# Patient Record
Sex: Female | Born: 1955 | Race: White | Hispanic: No | Marital: Married | Smoking: Never smoker
Health system: Southern US, Community
[De-identification: ages and names within clinical notes are randomized; demographics above are authoritative.]

## PROBLEM LIST (undated history)

## (undated) DIAGNOSIS — D219 Benign neoplasm of connective and other soft tissue, unspecified: Secondary | ICD-10-CM

## (undated) HISTORY — PX: EYE SURGERY: SHX253

## (undated) HISTORY — DX: Benign neoplasm of connective and other soft tissue, unspecified: D21.9

## (undated) HISTORY — PX: TONSILLECTOMY AND ADENOIDECTOMY: SUR1326

---

## 1978-10-31 HISTORY — PX: TONSILLECTOMY: SUR1361

## 1999-04-05 ENCOUNTER — Other Ambulatory Visit: Admission: RE | Admit: 1999-04-05 | Discharge: 1999-04-05 | Payer: Self-pay | Admitting: Gynecology

## 2000-06-07 ENCOUNTER — Other Ambulatory Visit: Admission: RE | Admit: 2000-06-07 | Discharge: 2000-06-07 | Payer: Self-pay | Admitting: Gynecology

## 2000-07-27 ENCOUNTER — Ambulatory Visit (HOSPITAL_COMMUNITY): Admission: RE | Admit: 2000-07-27 | Discharge: 2000-07-27 | Payer: Self-pay | Admitting: Gastroenterology

## 2000-10-31 HISTORY — PX: COLONOSCOPY: SHX174

## 2000-12-21 ENCOUNTER — Other Ambulatory Visit: Admission: RE | Admit: 2000-12-21 | Discharge: 2000-12-21 | Payer: Self-pay | Admitting: Gynecology

## 2001-06-27 ENCOUNTER — Other Ambulatory Visit: Admission: RE | Admit: 2001-06-27 | Discharge: 2001-06-27 | Payer: Self-pay | Admitting: Gynecology

## 2002-06-28 ENCOUNTER — Other Ambulatory Visit: Admission: RE | Admit: 2002-06-28 | Discharge: 2002-06-28 | Payer: Self-pay | Admitting: Gynecology

## 2002-09-03 ENCOUNTER — Ambulatory Visit (HOSPITAL_COMMUNITY): Admission: RE | Admit: 2002-09-03 | Discharge: 2002-09-03 | Payer: Self-pay | Admitting: Gynecology

## 2002-09-03 ENCOUNTER — Encounter: Payer: Self-pay | Admitting: Gynecology

## 2003-07-16 ENCOUNTER — Other Ambulatory Visit: Admission: RE | Admit: 2003-07-16 | Discharge: 2003-07-16 | Payer: Self-pay | Admitting: Gynecology

## 2004-09-09 ENCOUNTER — Other Ambulatory Visit: Admission: RE | Admit: 2004-09-09 | Discharge: 2004-09-09 | Payer: Self-pay | Admitting: Gynecology

## 2004-09-30 HISTORY — PX: BLADDER SUSPENSION: SHX72

## 2004-10-05 ENCOUNTER — Ambulatory Visit (HOSPITAL_COMMUNITY): Admission: RE | Admit: 2004-10-05 | Discharge: 2004-10-05 | Payer: Self-pay | Admitting: Gynecology

## 2004-11-15 ENCOUNTER — Ambulatory Visit (HOSPITAL_COMMUNITY): Admission: RE | Admit: 2004-11-15 | Discharge: 2004-11-15 | Payer: Self-pay | Admitting: Urology

## 2005-10-18 ENCOUNTER — Other Ambulatory Visit: Admission: RE | Admit: 2005-10-18 | Discharge: 2005-10-18 | Payer: Self-pay | Admitting: Gynecology

## 2005-10-31 DIAGNOSIS — D219 Benign neoplasm of connective and other soft tissue, unspecified: Secondary | ICD-10-CM

## 2005-10-31 HISTORY — DX: Benign neoplasm of connective and other soft tissue, unspecified: D21.9

## 2005-10-31 HISTORY — PX: ABDOMINAL HYSTERECTOMY: SHX81

## 2005-10-31 HISTORY — PX: LAPAROSCOPIC BILATERAL SALPINGO OOPHERECTOMY: SHX5890

## 2005-11-01 ENCOUNTER — Encounter: Admission: RE | Admit: 2005-11-01 | Discharge: 2005-11-01 | Payer: Self-pay | Admitting: Gynecology

## 2006-06-19 ENCOUNTER — Encounter (INDEPENDENT_AMBULATORY_CARE_PROVIDER_SITE_OTHER): Payer: Self-pay | Admitting: *Deleted

## 2006-06-19 ENCOUNTER — Inpatient Hospital Stay (HOSPITAL_COMMUNITY): Admission: RE | Admit: 2006-06-19 | Discharge: 2006-06-21 | Payer: Self-pay | Admitting: Gynecology

## 2006-10-20 ENCOUNTER — Other Ambulatory Visit: Admission: RE | Admit: 2006-10-20 | Discharge: 2006-10-20 | Payer: Self-pay | Admitting: Gynecology

## 2006-11-02 ENCOUNTER — Encounter: Admission: RE | Admit: 2006-11-02 | Discharge: 2006-11-02 | Payer: Self-pay | Admitting: Gynecology

## 2007-10-31 ENCOUNTER — Other Ambulatory Visit: Admission: RE | Admit: 2007-10-31 | Discharge: 2007-10-31 | Payer: Self-pay | Admitting: Gynecology

## 2007-11-09 ENCOUNTER — Ambulatory Visit (HOSPITAL_COMMUNITY): Admission: RE | Admit: 2007-11-09 | Discharge: 2007-11-09 | Payer: Self-pay | Admitting: Gynecology

## 2008-11-03 ENCOUNTER — Ambulatory Visit: Payer: Self-pay | Admitting: Gynecology

## 2008-11-03 ENCOUNTER — Encounter: Payer: Self-pay | Admitting: Gynecology

## 2008-11-03 ENCOUNTER — Other Ambulatory Visit: Admission: RE | Admit: 2008-11-03 | Discharge: 2008-11-03 | Payer: Self-pay | Admitting: Gynecology

## 2008-11-17 ENCOUNTER — Ambulatory Visit (HOSPITAL_COMMUNITY): Admission: RE | Admit: 2008-11-17 | Discharge: 2008-11-17 | Payer: Self-pay | Admitting: Gynecology

## 2010-01-08 ENCOUNTER — Ambulatory Visit (HOSPITAL_COMMUNITY): Admission: RE | Admit: 2010-01-08 | Discharge: 2010-01-08 | Payer: Self-pay | Admitting: Family Medicine

## 2010-11-20 ENCOUNTER — Encounter: Payer: Self-pay | Admitting: Gynecology

## 2010-12-22 ENCOUNTER — Other Ambulatory Visit (HOSPITAL_COMMUNITY): Payer: Self-pay | Admitting: Family Medicine

## 2010-12-22 DIAGNOSIS — Z139 Encounter for screening, unspecified: Secondary | ICD-10-CM

## 2011-01-11 ENCOUNTER — Ambulatory Visit (HOSPITAL_COMMUNITY): Payer: Self-pay

## 2011-01-13 ENCOUNTER — Ambulatory Visit (HOSPITAL_COMMUNITY)
Admission: RE | Admit: 2011-01-13 | Discharge: 2011-01-13 | Disposition: A | Discharge status: Home / Self Care | Payer: 59 | Source: Ambulatory Visit | Attending: Family Medicine | Admitting: Family Medicine

## 2011-01-13 DIAGNOSIS — Z1231 Encounter for screening mammogram for malignant neoplasm of breast: Secondary | ICD-10-CM | POA: Insufficient documentation

## 2011-01-13 DIAGNOSIS — Z139 Encounter for screening, unspecified: Secondary | ICD-10-CM

## 2011-03-18 NOTE — Op Note (Signed)
[name], [name]               ACCOUNT NO.:  [id number]   MEDICAL RECORD NO.:  [id number]          PATIENT TYPE:  AMB   LOCATION:  DAY                          FACILITY:  [facility]   PHYSICIAN:  Sigmund I. Patsi Sears, M.D.DATE OF BIRTH:  1956-03-12   DATE OF PROCEDURE:  DATE OF DISCHARGE:                                 OPERATIVE REPORT   PREOPERATIVE DIAGNOSIS:  Stress urinary incontinence.   POSTOPERATIVE DIAGNOSIS:  Stress urinary incontinence.   OPERATION:  Implantation of transobturator pubovesical sling [facility]  scientific).   PREPARATION:  After preoperative preanesthesia, the patient was brought to  the operating room and was placed on the operating room table in dorsal  supine position where general LMA anesthesia was introduced.  She was then  placed in the dorsal lithotomy position.  The pubis was prepped with  Betadine solution and draped in the usual fashion.   PROCEDURE:  A 1.5 cm incision was made in the midline of the mid-urethra.  After injecting the urethra with plain Marcaine.  Following this, dissection  was accomplished by laterally to the level of the retropubic space.  Two  separate stab wounds were placed 5 cm lateral to the clitoris and after  this, the pubovaginal sling was placed in standard retrograde fashion  Conservation officer, historic buildings).  Cystoscopy revealed that the bladder was intact, with  no trauma.  Following this, the Foley catheter was reinserted with bladder  drain and fluid.  The sling was tensioned correctly.  The sleeves cut and  the casing removed.  Each individual sling was then cut subcutaneously.  The  wound was closed in two layers with a 3-0 Vicryl suture.  The wound was  irrigated with antibiotic irrigation.  Dermabond was used to close the stab  wounds.  Following this, the patient was awakened and taken to the recovery  room in good condition.      SIT/MEDQ  D:  11/15/2004  T:  11/15/2004  Job:  [id number]

## 2011-03-18 NOTE — Discharge Summary (Signed)
NAMEJERELINE, [name]               ACCOUNT NO.:  [id number]   MEDICAL RECORD NO.:  [id number]          PATIENT TYPE:  INP   LOCATION:  9302                          FACILITY:  [facility]   PHYSICIAN:  Timothy P. Fontaine, M.D.DATE OF BIRTH:  Mar 14, 1956   DATE OF ADMISSION:  06/19/2006  DATE OF DISCHARGE:  06/21/2006                                 DISCHARGE SUMMARY   DISCHARGE DIAGNOSES:  Leiomyomata.   PROCEDURE:  Total abdominal hysterectomy, bilateral salpingo-oophorectomy  06/19/06.   Pathology pending   HOSPITAL COURSE:  54 year old G2, P2 female history of rapidly enlarging  leiomyomata underwent uncomplicated TAH BSO 06/19/2006.  The patient's  postoperative course has been uncomplicated.  She was discharged on  postoperative day #2 ambulating well, tolerating a regular diet with a post  operative hemoglobin of 10.8.  The patient received precautions,  instructions and follow-up, will be seen the office in 2 weeks.  Received a  prescription for Tylox #30 one to two p.o. q. 46 hours p.r.n. pain.  The  pathology report was pending at the time of this dictation.      Timothy P. Fontaine, M.D.  Electronically Signed     TPF/MEDQ  D:  06/21/2006  T:  06/21/2006  Job:  [id number]

## 2011-03-18 NOTE — Procedures (Signed)
[place]  Patient:    Tina [name], Tina [name]                      MRN: [id number] Proc. Date: 07/27/00 Adm. Date:  [id number] Attending:  Charna Elizabeth CC:         Timothy P. Fontaine, M.D.   Procedure Report  DATE OF BIRTH:  Nov 13, 1955  REFERRING PHYSICIAN:  Nadyne Coombes. Fontaine, M.D.  PROCEDURE PERFORMED:  Colonoscopy.  ENDOSCOPIST:  Anselmo Rod, M.D.  INSTRUMENT USED:  Olympus video colonoscope.  INDICATIONS FOR PROCEDURE:  Blood in stool in this 55 year old white female rule out colonic polyps, masses, hemorrhoids, etc.  PREPROCEDURE PREPARATION:  Informed consent was procured from the patient. The patient was fasted for eight hours prior to the procedure and prepped with a bottle of magnesium citrate and a gallon of NuLytely the night prior to the procedure.  PREPROCEDURE PHYSICAL:  The patient had stable vital signs.  Neck supple. Chest clear to auscultation.  S1, S2 regular.  Abdomen soft with normal abdominal bowel sounds.  DESCRIPTION OF PROCEDURE:  The patient was placed in the left lateral decubitus position and sedated with 75 mg of Demerol and 7.5 mg of Versed intravenously.  Once the patient was adequately sedated and maintained on low-flow oxygen and continuous cardiac monitoring, the Olympus video colonoscope was advanced from the rectum to the cecum with extreme difficulty secondary to a large amount of residual stool in the colon.  No large masses, polyps, erosions or ulcerations were seen.  However, a very small lesion may have been missed secondary to a significant amount of residual stool in the colon.  The patient had prominent external hemorrhoids which I suspect may be the source of the patients rectal bleeding.  IMPRESSION: 1. Normal-appearing colon. 2. Small nonbleeding internal and prominent external hemorrhoids.  RECOMMENDATIONS: 1. The patient has been advised to increase the fluid and fiber in her  diet. 2. Outpatient follow-up has been advised on a p.r.n. basis.DD:  07/27/00 TD:  07/27/00 Job: [id number]

## 2011-03-18 NOTE — Op Note (Signed)
NAMEVIRNA, Tina [name]               ACCOUNT NO.:  [id number]   MEDICAL RECORD NO.:  [id number]          PATIENT TYPE:  INP   LOCATION:  9302                          FACILITY[facility]  [facility]   PHYSICIAN:  Timothy P. Fontaine, M.D.DATE OF BIRTH:  01/31/1956   DATE OF PROCEDURE:  DATE OF DISCHARGE:                                 OPERATIVE REPORT   PREOPERATIVE DIAGNOSES:  Leiomyomata uteri.   POSTOPERATIVE DIAGNOSES:  Leiomyomata uteri.   PROCEDURE:  Total abdominal hysterectomy, bilateral salpingo-oophorectomy.   SURGEON:  Timothy P. Fontaine, M.D.   ASSISTANT:  Rande Brunt. Eda Paschal, M.D.   ANESTHETIC:  Regional.   SPECIMEN:  Uterus, right and left ovary, right left fallopian tube.  Immediate postop weight 938 grams.   ESTIMATED BLOOD LOSS:  Approximately 500 mL.   COMPLICATIONS:  None.   FINDINGS:  Uterus with multiple leiomyomata, large anterior lower uterine  segment approximately 10 cm, total uterine weight 938 grams, right and left  fallopian tubes, right and left ovaries grossly normal.  Pelvis otherwise  normal to inspection.  Upper abdominal palpable exam grossly normal.   PROCEDURE:  The patient was taken to the operating room, underwent regional  anesthesia, was placed in supine position, received abdominal perineal  vaginal preparation with Betadine solution and Foley catheter was placed in  sterile technique.  The patient was then draped in usual fashion.  After  assuring adequate anesthesia, the abdomen sharply entered through a  Pfannenstiel incision achieving adequate hemostasis at all levels.  Balfour  retractor and bladder blade were placed within the incision and the  intestines were packed from the operative site.  Uterus was elevated from  the pelvis.  The right infundibulopelvic ligament and vessels were  identified.  A transperitoneal window created, the pedicle doubly clamped,  cut and doubly ligated using 0 Vicryl suture.  A similar procedure was  carried out  on the other side.  The posterior leaf of the broad ligament was  then sharply incised bilaterally along the posterior uterine surface.  The  right round ligament was identified, transected with electrocautery and  initiation of the peritoneal bladder flap sharply was begun.  A similar  procedure was carried out on the other side.  At that became apparent due to  the large anterior lower uterine segment myoma that a myomectomy need to be  performed to allow [place] to proceed with the hysterectomy.  Subsequently using  Vasopressin mixture of 20 units in 50 mL the uterine serosa overlying the  myoma was injected and subsequently incised with electrocautery.  Through  sharp and blunt dissection the myoma was removed which subsequently allowed  visualization of the parametrial tissues.  The right and left the uterine  vessels were then skeletonized, clamped, cut and ligated using 0 Vicryl  suture.  Initially the parametrial paracervical tissues were clamped, cut  and ligated to progressively free the uterus and again at this point due to  the length of the cervix, the supracervical hysterectomy was performed to  allow adequate visualization.  The bladder flap was continuously sharply  bluntly developed throughout this entire process  without difficulty.  Subsequently the vagina was sharply entered in the cervical stump was  excised circumferentially.  Right and left vaginal angle sutures were placed  using 0 Vicryl suture tagged for future reference.  The vagina was then  closed anterior to posterior through progressive figure-of-eight sutures  using 0 Vicryl suture, achieving ultimate hemostasis and closure.  The  pelvis was then copiously irrigated.  Adequate hemostasis was visualized,  bowel packing was removed.  Balfour retractor bladder blade removed.  The  anterior fascia was then reapproximated using 0 Vicryl suture in a running  stitch.  Subcutaneous tissues were irrigated.  Hemostasis  achieved with  electrocautery and the skin was reapproximated using 4-0 Vicryl in a running  subcuticular stitch.  Steri-Strips and Benzoin were applied.  Sterile  dressing applied.  The patient was taken to recovery room in good condition  having tolerated procedure well.      Timothy P. Fontaine, M.D.  Electronically Signed     TPF/MEDQ  D:  06/19/2006  T:  06/19/2006  Job:  [id number]

## 2011-03-18 NOTE — H&P (Signed)
Tina [name], Tina [name]               ACCOUNT NO.:  [id number]   MEDICAL RECORD NO.:  [id number]          PATIENT TYPE:  AMB   LOCATION:  SDC                           FACILITY:  [facility]   PHYSICIAN:  Timothy P. Fontaine, M.D.DATE OF BIRTH:  July 28, 1956   DATE OF ADMISSION:  DATE OF DISCHARGE:                                HISTORY & PHYSICAL   DATE OF SURGERY:  June 19, 2006, 10 a.m., [facility].   CHIEF COMPLAINT:  Leiomyomata.   HISTORY OF PRESENT ILLNESS:  A 55 year old G2, P2 female with a history of  rapidly enlarging leiomyomata.  The patient was seen approximately 6 months  prior to evaluation with a normal pelvic.  Presented complaining of  abdominal discomfort and was found to have enlarged uterus.  Pelvic exam  revealed approximately 16-week-size irregular uterus and ultrasound  confirmed an enlarged uterus with multiple myomas.  The patient is  complaining of increasing pelvic pressure and discomfort and is admitted at  this time for TAH/BSO.   PAST MEDICAL HISTORY:  Unremarkable.   PAST SURGICAL HISTORY:  Includes cataract surgery, eye laceration repair,  tonsillectomy and bladder sling procedure per urology.   CURRENT MEDICATIONS:  None.   ALLERGIES:  None.   REVIEW OF SYSTEMS:  Noncontributory.   FAMILY HISTORY:  Noncontributory.   SOCIAL HISTORY:  Noncontributory.   ADMISSION PHYSICAL EXAMINATION:  VITAL SIGNS:  Afebrile.  Vital signs are  stable.  HEENT:  Normal.  LUNGS:  Clear.  CARDIAC:  Regular rate without rubs, murmurs or gallops.  ABDOMEN:  Benign with a palpable pelvic mass above the symphysis.  PELVIC:  External, BUS, vagina normal.  Cervix normal.  Uterus 16 weeks  size, irregular.  Adnexa without masses or tenderness.   ASSESSMENT:  A 55 year old G2, P2 female, vasectomy birth control, with  history of relatively new onset enlarging uterus with ultrasound confirming  multiple myomas.  Right and left ovaries visualized and were grossly  normal.  Various options for management reviewed and she is admitted at this time for  TAH/BSO.  Long-term issues with hysterectomy were reviewed.  She understands  that hysterectomy is absolute and irreversible sterility.  Sexuality  following hysterectomy was also discussed and the potential for orgasmic  dysfunction and persistent dyspareunia was reviewed, understood and  accepted.  The ovarian conservation issue was reviewed with her and the  options of keeping her ovaries for continued hormonal production, accepting  the risk for ovarian disease in the future both benign requiring reoperation  as well as ovarian cancer in the future, versus removing her ovaries and the  issues of hypoestrogenism and symptoms and the potential for hormone  replacement therapy was discussed.  The patient desires both ovaries  removed.  She accepts the issues of hypoestrogenism and the potential for  hormone replacement therapy.  She and I discussed estrogen replacement  therapy and the potential risks to include stroke, heart attack, DVT, as  well as the issues of breast cancer associated with estrogen replacement  therapy, all of which she understands and accepts.  The expected  intraoperative/postoperative courses were reviewed  and the acute  intraoperative/postoperative risks discussed.  The issues of anesthesia and  the postoperative risks of thrombosis, DVT, pulmonary embolus were reviewed.  The risks of infection both internal requiring prolonged antibiotics,  internal abscess formation, or cuff hematoma requiring reoperation,  abscess/hematoma drainage, as well as the risks of wound complications  requiring opening and draining of incisions and closure by secondary  intention was all discussed, understood and accepted.  The risks of  hemorrhage necessitating transfusion and risks of transfusion were reviewed  to include transfusion reaction, hepatitis, HIV, mad cow disease and other  unknown  entities.  The risk of inadvertent injury to internal organs  including bowel, bladder, ureters, vessels and nerves necessitating major  exploratory reparative surgeries and future reparative surgeries including  ostomy formation, both immediately recognized and delay recognized was all  discussed, understood and accepted.  She has had a bladder sling  transvaginal.  I do not anticipate this to be an issue but I did discuss  with her the potential for peribladder adhesive disease increasing the risk  of bladder injury and she understands and accepts this, as well as the  potential for supracervical hysterectomy and leaving of the cervix, all of  which she understands and accepts.  The patient's questions were answered to  her satisfaction.  She is ready to proceed with surgery.      Timothy P. Fontaine, M.D.  Electronically Signed     TPF/MEDQ  D:  06/09/2006  T:  06/09/2006  Job:  [id number]

## 2011-12-30 ENCOUNTER — Other Ambulatory Visit: Payer: Self-pay | Admitting: Family Medicine

## 2011-12-30 DIAGNOSIS — Z139 Encounter for screening, unspecified: Secondary | ICD-10-CM

## 2012-01-17 ENCOUNTER — Ambulatory Visit (HOSPITAL_COMMUNITY)
Admission: RE | Admit: 2012-01-17 | Discharge: 2012-01-17 | Disposition: A | Discharge status: Home / Self Care | Payer: 59 | Source: Ambulatory Visit | Attending: Family Medicine | Admitting: Family Medicine

## 2012-01-17 DIAGNOSIS — Z1231 Encounter for screening mammogram for malignant neoplasm of breast: Secondary | ICD-10-CM | POA: Insufficient documentation

## 2012-01-17 DIAGNOSIS — Z139 Encounter for screening, unspecified: Secondary | ICD-10-CM

## 2012-03-28 ENCOUNTER — Encounter: Payer: Self-pay | Admitting: *Deleted

## 2012-03-28 ENCOUNTER — Encounter: Payer: Self-pay | Admitting: Gynecology

## 2012-03-28 ENCOUNTER — Ambulatory Visit (INDEPENDENT_AMBULATORY_CARE_PROVIDER_SITE_OTHER): Payer: 59 | Admitting: Gynecology

## 2012-03-28 DIAGNOSIS — N751 Abscess of Bartholin's gland: Secondary | ICD-10-CM

## 2012-03-28 NOTE — Progress Notes (Signed)
56 year old G2 P2 female status post TAH BSO for leiomyoma complaining of swollen right vaginal area which began to drain earlier today. Onset several days with no history of this previously. Was very tender to the touch and with sitting. Has not been in the office for over 3 years. She is receiving her routine exams to include breast and pelvic exams by her primary physician.  Exam with Sherri chaperone present. Abdomen soft nontender without masses guarding rebound organomegaly Pelvic external BUS with swollen right Bartholin area with small draining pinpoint area overlying this area.  Underlying nodularity approximately a centimeter mildly tender to palpation. Vagina grossly normal without palpable or visual abnormalities. Bimanual without masses or tenderness. Rectovaginal exam is normal.  Assessment and plan: Draining right Bartholin abscess. Recommend warm soaks/sitz baths. Patient will represent in 2-4 weeks for reinspection. She'll continue to follow up with her primary physician for routine health care.

## 2012-03-28 NOTE — Patient Instructions (Signed)
Sitz bath's/warm soaks to the Bartholin's gland area. Follow up for reexam in 2-4 weeks. Follow up sooner if abscess recurs.

## 2012-04-16 ENCOUNTER — Encounter: Payer: Self-pay | Admitting: Gynecology

## 2012-04-16 ENCOUNTER — Ambulatory Visit (INDEPENDENT_AMBULATORY_CARE_PROVIDER_SITE_OTHER): Payer: 59 | Admitting: Gynecology

## 2012-04-16 VITALS — BP 118/70

## 2012-04-16 DIAGNOSIS — N751 Abscess of Bartholin's gland: Secondary | ICD-10-CM

## 2012-04-16 NOTE — Progress Notes (Signed)
Patient presents in follow up of right Bartholin's abscess. She's been using sitz baths and this area has resolved.  Exam with Elane Fritz chaperone present External normal without residual swelling/nodularity. BUS vagina normal. Bimanual without masses or tenderness.  Assessment and plan: Resolved Bartholin abscess. Patient has no history of this previously. We'll monitor her and if she has any recurrences will represent. Otherwise she will follow up routinely with her primary who does her routine gynecologic care.

## 2012-04-16 NOTE — Patient Instructions (Signed)
Represent if there is any recurrence of the abscess. Otherwise follow up routinely when you're due for your annual exam.

## 2013-01-02 ENCOUNTER — Other Ambulatory Visit: Payer: Self-pay | Admitting: Gynecology

## 2013-01-02 DIAGNOSIS — Z1231 Encounter for screening mammogram for malignant neoplasm of breast: Secondary | ICD-10-CM

## 2013-01-02 DIAGNOSIS — Z139 Encounter for screening, unspecified: Secondary | ICD-10-CM

## 2013-01-16 ENCOUNTER — Ambulatory Visit
Admission: RE | Admit: 2013-01-16 | Discharge: 2013-01-16 | Disposition: A | Discharge status: Home / Self Care | Payer: BC Managed Care – PPO | Source: Ambulatory Visit | Attending: Gynecology | Admitting: Gynecology

## 2013-01-16 DIAGNOSIS — Z1231 Encounter for screening mammogram for malignant neoplasm of breast: Secondary | ICD-10-CM

## 2013-03-30 ENCOUNTER — Encounter: Payer: Self-pay | Admitting: *Deleted

## 2013-04-11 ENCOUNTER — Ambulatory Visit (INDEPENDENT_AMBULATORY_CARE_PROVIDER_SITE_OTHER): Payer: BC Managed Care – PPO | Admitting: Nurse Practitioner

## 2013-04-11 ENCOUNTER — Telehealth: Payer: Self-pay | Admitting: Nurse Practitioner

## 2013-04-11 ENCOUNTER — Encounter: Payer: Self-pay | Admitting: Nurse Practitioner

## 2013-04-11 VITALS — BP 110/77 | HR 72 | Ht 63.0 in | Wt 202.8 lb

## 2013-04-11 DIAGNOSIS — R5381 Other malaise: Secondary | ICD-10-CM

## 2013-04-11 DIAGNOSIS — R5383 Other fatigue: Secondary | ICD-10-CM

## 2013-04-11 DIAGNOSIS — Z13220 Encounter for screening for lipoid disorders: Secondary | ICD-10-CM

## 2013-04-11 DIAGNOSIS — Z01419 Encounter for gynecological examination (general) (routine) without abnormal findings: Secondary | ICD-10-CM

## 2013-04-11 DIAGNOSIS — Z0000 Encounter for general adult medical examination without abnormal findings: Secondary | ICD-10-CM

## 2013-04-11 DIAGNOSIS — B354 Tinea corporis: Secondary | ICD-10-CM

## 2013-04-11 MED ORDER — CLOTRIMAZOLE-BETAMETHASONE 1-0.05 % EX CREA: TOPICAL_CREAM | Freq: Two times a day (BID) | CUTANEOUS | Status: DC

## 2013-04-11 NOTE — Telephone Encounter (Signed)
Pt was prescribed a medication this morning but when pt went to pick up med it was quite costly. Can she be prescribed something else that will do the same thing but cost less? [facility] [place]

## 2013-04-11 NOTE — Patient Instructions (Signed)
Luvena 2-3 x per week as needed for vaginal health

## 2013-04-11 NOTE — Progress Notes (Signed)
Subjective:    Patient ID: Tina [name], female    DOB: March 31, 1956, 57 y.o.   MRN: [id number]  HPI presents for her wellness checkup. Gets regular dental exams. Regular eye exams. 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 Has had a rash on her ankle for the past couple weeks. Mildly pruritic. Is due for her colonoscopy. No vaginal discharge. No pelvic pain. Same sexual partner. PMH includes hysterectomy and bilateral oophorectomy.    Review of Systems  Constitutional: Negative for activity change, appetite change and fatigue.  HENT: Positive for sneezing. Negative for congestion, rhinorrhea and dental problem.   Respiratory: Negative for chest tightness and shortness of breath.   Cardiovascular: Negative for chest pain.  Gastrointestinal: Negative for abdominal distention.  Genitourinary: Negative for dysuria, urgency, frequency, vaginal discharge, difficulty urinating, menstrual problem and pelvic pain.  Psychiatric/Behavioral: Negative for sleep disturbance.       Objective:   Physical Exam  Constitutional: She is oriented to person, place, and time. She appears well-developed. No distress.  HENT:  Right Ear: External ear normal.  Left Ear: External ear normal.  Mouth/Throat: Oropharynx is clear and moist.  Neck: Normal range of motion. Neck supple. No tracheal deviation present. No thyromegaly present.  Cardiovascular: Normal rate, regular rhythm and normal heart sounds.  Exam reveals no gallop.   No murmur heard. Pulmonary/Chest: Effort normal and breath sounds normal.  Abdominal: Soft. She exhibits no distension. There is no tenderness.  Genitourinary: No vaginal discharge found.  Musculoskeletal: She exhibits no edema.  Lymphadenopathy:    She has no cervical adenopathy.  Neurological: She is alert and oriented to person, place, and time.  Skin: Skin is warm and dry. No rash noted.  Psychiatric: She has a normal mood and affect. Her behavior is normal.   Breast exam: No masses  noted. Axilla no adenopathy. External GU normal limit. Rectal exam deferred. Patient plans to get colonoscopy. Correction to note above regarding skin: A faint pink circular well defined lesion noted on the right ankle with central clearing. Also patient has significant sun damage with multiple keratoses.   Assessment & Plan:  Well woman exam  Fatigue - Plan: Basic metabolic panel, Hepatic function panel, TSH, Vitamin D 25 hydroxy, Basic metabolic panel, Hepatic function panel, TSH, Vitamin D 25 hydroxy  Need for lipid screening - Plan: Lipid panel, Lipid panel  Tinea corporis  Meds ordered this encounter  Medications  . DISCONTD: clotrimazole-betamethasone (LOTRISONE) cream    Sig: Apply topically 2 (two) times daily. Prn rash    Dispense:  45 g    Refill:  0    Order Specific Question:  Supervising Provider    Answer:  Merlyn Albert [[id number]]  . triamcinolone cream (KENALOG) 0.1 %    Sig: Apply topically 2 (two) times daily. Prn itching up to 2 weeks    Dispense:  30 g    Refill:  0    Order Specific Question:  Supervising Provider    Answer:  Merlyn Albert [[id number]]  . ketoconazole (NIZORAL) 2 % cream    Sig: Apply topically daily. Prn to rash on leg    Dispense:  15 g    Refill:  0    Order Specific Question:  Supervising Provider    Answer:  Merlyn Albert [[id number]]   recommend that patient see a dermatologist for skin cancer screening. Given information on colonoscopy so she can schedule. Continue vitamin D and calcium supplementation. Encourage healthy diet and regular  activity. Next physical in one year.

## 2013-04-12 LAB — BASIC METABOLIC PANEL
BUN: 12 mg/dL (ref 6–23)
Chloride: 108 mEq/L (ref 96–112)
Potassium: 4.6 mEq/L (ref 3.5–5.3)
Sodium: 143 mEq/L (ref 135–145)

## 2013-04-12 LAB — HEPATIC FUNCTION PANEL
ALT: 17 U/L (ref 0–35)
AST: 15 U/L (ref 0–37)
Alkaline Phosphatase: 69 U/L (ref 39–117)
Bilirubin, Direct: 0.1 mg/dL (ref 0.0–0.3)
Indirect Bilirubin: 0.4 mg/dL (ref 0.0–0.9)
Total Bilirubin: 0.5 mg/dL (ref 0.3–1.2)

## 2013-04-12 LAB — LIPID PANEL
LDL Cholesterol: 118 mg/dL — ABNORMAL HIGH (ref 0–99)
Total CHOL/HDL Ratio: 4.1 Ratio
VLDL: 20 mg/dL (ref 0–40)

## 2013-04-12 LAB — TSH: TSH: 2.155 u[IU]/mL (ref 0.350–4.500)

## 2013-04-12 MED ORDER — TRIAMCINOLONE ACETONIDE 0.1 % EX CREA: TOPICAL_CREAM | Freq: Two times a day (BID) | CUTANEOUS | Status: DC

## 2013-04-12 MED ORDER — KETOCONAZOLE 2 % EX CREA: TOPICAL_CREAM | Freq: Every day | CUTANEOUS | Status: DC

## 2013-04-12 NOTE — Telephone Encounter (Signed)
Patient notified

## 2013-04-12 NOTE — Telephone Encounter (Signed)
I will switch to 2 generic creams; one for itching and one for fungal infections.

## 2013-04-13 LAB — VITAMIN D 25 HYDROXY (VIT D DEFICIENCY, FRACTURES): Vit D, 25-Hydroxy: 49 ng/mL (ref 30–89)

## 2013-04-15 ENCOUNTER — Encounter: Payer: Self-pay | Admitting: Nurse Practitioner

## 2013-04-15 DIAGNOSIS — H5440 Blindness, one eye, unspecified eye: Secondary | ICD-10-CM | POA: Insufficient documentation

## 2013-04-16 ENCOUNTER — Telehealth: Payer: Self-pay | Admitting: Family Medicine

## 2013-04-16 NOTE — Telephone Encounter (Signed)
Sent patient a copy of the letter / encounter dos 04/11/13.. Mailed out 04/17/13

## 2013-04-18 ENCOUNTER — Telehealth: Payer: Self-pay | Admitting: Family Medicine

## 2013-04-18 NOTE — Telephone Encounter (Signed)
Sent patient a copy of the letter / encounter dos 04/15/13.. Mailed out 04/18/13 - kal (closed out encounter)

## 2014-01-07 ENCOUNTER — Other Ambulatory Visit: Payer: Self-pay | Admitting: Gynecology

## 2014-01-07 DIAGNOSIS — Z1231 Encounter for screening mammogram for malignant neoplasm of breast: Secondary | ICD-10-CM

## 2014-01-30 ENCOUNTER — Ambulatory Visit (HOSPITAL_COMMUNITY)
Admission: RE | Admit: 2014-01-30 | Discharge: 2014-01-30 | Disposition: A | Discharge status: Home / Self Care | Payer: BC Managed Care – PPO | Source: Ambulatory Visit | Attending: Gynecology | Admitting: Gynecology

## 2014-01-30 DIAGNOSIS — Z1231 Encounter for screening mammogram for malignant neoplasm of breast: Secondary | ICD-10-CM

## 2014-07-28 ENCOUNTER — Encounter: Payer: BC Managed Care – PPO | Admitting: Nurse Practitioner

## 2014-07-31 ENCOUNTER — Encounter: Payer: Self-pay | Admitting: Nurse Practitioner

## 2014-07-31 ENCOUNTER — Ambulatory Visit (INDEPENDENT_AMBULATORY_CARE_PROVIDER_SITE_OTHER): Payer: BC Managed Care – PPO | Admitting: Nurse Practitioner

## 2014-07-31 VITALS — BP 116/76 | Ht 63.0 in | Wt 189.0 lb

## 2014-07-31 DIAGNOSIS — N644 Mastodynia: Secondary | ICD-10-CM

## 2014-07-31 DIAGNOSIS — Z13220 Encounter for screening for lipoid disorders: Secondary | ICD-10-CM

## 2014-07-31 DIAGNOSIS — Z01419 Encounter for gynecological examination (general) (routine) without abnormal findings: Secondary | ICD-10-CM

## 2014-07-31 DIAGNOSIS — K5900 Constipation, unspecified: Secondary | ICD-10-CM

## 2014-07-31 DIAGNOSIS — Z79899 Other long term (current) drug therapy: Secondary | ICD-10-CM

## 2014-07-31 DIAGNOSIS — K5904 Chronic idiopathic constipation: Secondary | ICD-10-CM | POA: Insufficient documentation

## 2014-07-31 DIAGNOSIS — Z0000 Encounter for general adult medical examination without abnormal findings: Secondary | ICD-10-CM

## 2014-07-31 MED ORDER — KETOCONAZOLE 2 % EX CREA: TOPICAL_CREAM | Freq: Every day | CUTANEOUS | Status: DC

## 2014-07-31 NOTE — Patient Instructions (Signed)
amitiza Linzess

## 2014-08-02 LAB — BASIC METABOLIC PANEL
BUN: 14 mg/dL (ref 6–23)
CHLORIDE: 105 meq/L (ref 96–112)
CO2: 29 mEq/L (ref 19–32)
CREATININE: 0.81 mg/dL (ref 0.50–1.10)
Calcium: 9.5 mg/dL (ref 8.4–10.5)
Glucose, Bld: 96 mg/dL (ref 70–99)
POTASSIUM: 5 meq/L (ref 3.5–5.3)
Sodium: 139 mEq/L (ref 135–145)

## 2014-08-02 LAB — HEPATIC FUNCTION PANEL
ALBUMIN: 4.1 g/dL (ref 3.5–5.2)
ALK PHOS: 65 U/L (ref 39–117)
ALT: 12 U/L (ref 0–35)
AST: 13 U/L (ref 0–37)
Bilirubin, Direct: 0.1 mg/dL (ref 0.0–0.3)
Indirect Bilirubin: 0.6 mg/dL (ref 0.2–1.2)
TOTAL PROTEIN: 6.9 g/dL (ref 6.0–8.3)
Total Bilirubin: 0.7 mg/dL (ref 0.2–1.2)

## 2014-08-02 LAB — LIPID PANEL
CHOL/HDL RATIO: 3.7 ratio
Cholesterol: 174 mg/dL (ref 0–200)
HDL: 47 mg/dL (ref >39)
LDL CALC: 114 mg/dL — AB (ref 0–99)
TRIGLYCERIDES: 66 mg/dL (ref <150)
VLDL: 13 mg/dL (ref 0–40)

## 2014-08-04 ENCOUNTER — Encounter: Payer: Self-pay | Admitting: Nurse Practitioner

## 2014-08-04 NOTE — Progress Notes (Signed)
   Subjective:    Patient ID: Tina [name], female    DOB: Sep 06, 1956, 58 y.o.   MRN: [id number]  HPI presents for her wellness physical. Regular vision and dental exams. PMH includes TAH and BSO. Same sexual partner. Working on weight loss. Regular exercise. Takes vitamin D and calcium. Had a normal DEXA in 2009. Takes daily vitamin D and calcium. No fm hx of osteoporosis. Will be getting flu vaccine next week at work. Complaints of breast tenderness for over a week. No noted masses or changes in SBE. No fm hx of breast cancer.     Review of Systems  Constitutional: Negative for fever, activity change, appetite change and fatigue.  HENT: Negative for dental problem, ear pain, sinus pressure and sore throat.   Respiratory: Negative for cough, chest tightness, shortness of breath and wheezing.   Cardiovascular: Negative for chest pain.  Gastrointestinal: Positive for constipation and abdominal distention. Negative for nausea, vomiting, abdominal pain, diarrhea and blood in stool.  Genitourinary: Negative for dysuria, urgency, frequency, vaginal discharge, enuresis, difficulty urinating, menstrual problem and pelvic pain.       Objective:   Physical Exam  Vitals reviewed. Constitutional: She is oriented to person, place, and time. She appears well-developed. No distress.  HENT:  Right Ear: External ear normal.  Left Ear: External ear normal.  Mouth/Throat: Oropharynx is clear and moist.  Neck: Normal range of motion. Neck supple. No tracheal deviation present. No thyromegaly present.  Cardiovascular: Normal rate, regular rhythm and normal heart sounds.  Exam reveals no gallop.   No murmur heard. Pulmonary/Chest: Effort normal and breath sounds normal.  Abdominal: Soft. She exhibits no distension. There is no tenderness.  Genitourinary: Vagina normal. No vaginal discharge found.  External GU: no rashes or lesions. Vagina: no discharge. Rectal exam: no masses; no stool for hemoccult.     Musculoskeletal: She exhibits no edema.  Lymphadenopathy:    She has no cervical adenopathy.  Neurological: She is alert and oriented to person, place, and time.  Skin: Skin is warm and dry. No rash noted.  Psychiatric: She has a normal mood and affect. Her behavior is normal.  Breast: both breasts have dense tissue; no dominant masses; axillae no adenopathy. Left breast: no masses around areola; no signs of infection. Nipple nl in appearance. nontender to palpation.        Assessment & Plan:  Well woman exam  Breast tenderness - Plan: MM Digital Diagnostic Unilat L  Chronic idiopathic constipation  Screening for lipid disorders - Plan: Lipid panel  High risk medication use - Plan: Hepatic function panel, Basic metabolic panel  Consider daily med such as Amitiza or Linzess for constipation. Return in about 1 year (around 08/01/2015).

## 2014-08-05 ENCOUNTER — Encounter (HOSPITAL_COMMUNITY): Payer: BC Managed Care – PPO

## 2014-08-06 ENCOUNTER — Encounter: Payer: Self-pay | Admitting: Nurse Practitioner

## 2014-08-19 ENCOUNTER — Ambulatory Visit (HOSPITAL_COMMUNITY)
Admission: RE | Admit: 2014-08-19 | Discharge: 2014-08-19 | Disposition: A | Discharge status: Home / Self Care | Payer: BC Managed Care – PPO | Source: Ambulatory Visit | Attending: Nurse Practitioner | Admitting: Nurse Practitioner

## 2014-08-19 DIAGNOSIS — N644 Mastodynia: Secondary | ICD-10-CM | POA: Diagnosis present

## 2014-09-01 ENCOUNTER — Encounter: Payer: Self-pay | Admitting: Nurse Practitioner

## 2015-01-28 ENCOUNTER — Other Ambulatory Visit: Payer: Self-pay

## 2015-01-28 DIAGNOSIS — Z1231 Encounter for screening mammogram for malignant neoplasm of breast: Secondary | ICD-10-CM

## 2015-02-04 ENCOUNTER — Ambulatory Visit
Admission: RE | Admit: 2015-02-04 | Discharge: 2015-02-04 | Disposition: A | Discharge status: Home / Self Care | Payer: BC Managed Care – PPO | Source: Ambulatory Visit

## 2015-02-04 DIAGNOSIS — Z1231 Encounter for screening mammogram for malignant neoplasm of breast: Secondary | ICD-10-CM

## 2015-08-06 ENCOUNTER — Encounter: Payer: Self-pay | Admitting: Nurse Practitioner

## 2015-08-06 ENCOUNTER — Telehealth: Payer: Self-pay | Admitting: Nurse Practitioner

## 2015-08-06 ENCOUNTER — Ambulatory Visit (INDEPENDENT_AMBULATORY_CARE_PROVIDER_SITE_OTHER): Payer: BC Managed Care – PPO | Admitting: Nurse Practitioner

## 2015-08-06 VITALS — BP 118/80 | Ht 62.5 in | Wt 177.0 lb

## 2015-08-06 DIAGNOSIS — B353 Tinea pedis: Secondary | ICD-10-CM

## 2015-08-06 DIAGNOSIS — K5904 Chronic idiopathic constipation: Secondary | ICD-10-CM

## 2015-08-06 DIAGNOSIS — Z139 Encounter for screening, unspecified: Secondary | ICD-10-CM | POA: Diagnosis not present

## 2015-08-06 DIAGNOSIS — Z13220 Encounter for screening for lipoid disorders: Secondary | ICD-10-CM | POA: Diagnosis not present

## 2015-08-06 DIAGNOSIS — Z0000 Encounter for general adult medical examination without abnormal findings: Secondary | ICD-10-CM

## 2015-08-06 MED ORDER — TRIAMCINOLONE ACETONIDE 0.1 % EX CREA: 1.0000 "application " | TOPICAL_CREAM | Freq: Two times a day (BID) | CUTANEOUS | Status: DC

## 2015-08-06 NOTE — Progress Notes (Signed)
Subjective:    Patient ID: Tina [name], female    DOB: 02/03/1956, 59 y.o.   MRN: [id number]  HPI presents for her wellness exam. Overall healthy diet. Very active. Regular physician and dental exams. Has had her mammogram. Gets her flu vaccine at work. Has had a hysterectomy and bilateral oophorectomy. Same sexual partner. Has problems with constipation. Has tried high-fiber diet increase water intake bowel probiotics and magnesium supplement with minimal improvement. No blood in her stool. Colonoscopy is up-to-date.    Review of Systems  Constitutional: Negative for fever, activity change, appetite change and fatigue.  HENT: Negative for dental problem, ear pain, sinus pressure and sore throat.   Respiratory: Negative for cough, chest tightness, shortness of breath and wheezing.   Cardiovascular: Negative for chest pain.  Gastrointestinal: Positive for constipation. Negative for nausea, vomiting, abdominal pain, diarrhea, blood in stool and abdominal distention.  Genitourinary: Negative for dysuria, urgency, frequency, vaginal discharge, enuresis, difficulty urinating, genital sores and pelvic pain.  Skin: Positive for rash.       Chronic itchy rash mainly on the toes of the left foot.       Objective:   Physical Exam  Constitutional: She is oriented to person, place, and time. She appears well-developed. No distress.  HENT:  Right Ear: External ear normal.  Left Ear: External ear normal.  Mouth/Throat: Oropharynx is clear and moist.  Neck: Normal range of motion. Neck supple. No tracheal deviation present. No thyromegaly present.  Cardiovascular: Normal rate, regular rhythm and normal heart sounds.  Exam reveals no gallop.   No murmur heard. Pulmonary/Chest: Effort normal and breath sounds normal.  Abdominal: Soft. She exhibits no distension. There is no tenderness.  Genitourinary: Vagina normal and uterus normal. No vaginal discharge found.  Musculoskeletal: She exhibits no  edema.  Lymphadenopathy:    She has no cervical adenopathy.  Neurological: She is alert and oriented to person, place, and time.  Skin: Skin is warm and dry. No rash noted.  Confluent mildly erythematous rash noted over the toes with fairly well-defined border and a few small pink papules and dry skin noted mainly on the left foot. Slight changes noted in the toenails.  Psychiatric: She has a normal mood and affect. Her behavior is normal.  Vitals reviewed.  breast exam: Areas of dense tissue, no masses. Axilla no adenopathy.        Assessment & Plan:   Problem List Items Addressed This Visit      Digestive   Chronic idiopathic constipation   Relevant Orders   Basic metabolic panel   POC Hemoccult Bld/Stl (3-Cd Home Screen)     Musculoskeletal and Integument   Tinea pedis of both feet    Other Visit Diagnoses    Routine general medical examination at a health care facility    -  Primary    Relevant Orders    Lipid panel    Hepatic function panel    Basic metabolic panel    TSH    Vit D  25 hydroxy (rtn osteoporosis monitoring)    Lipid screening        Relevant Orders    Lipid panel    Screening        Relevant Orders    Lipid panel    Hepatic function panel    Basic metabolic panel    TSH    Vit D  25 hydroxy (rtn osteoporosis monitoring)      Encourage daily vitamin D and  calcium supplementation. OTC Lamisil combined with triamcinolone twice a day to rash when necessary. Call back if persists. Reviewed OTC measures such as Barbaraann Faster asked to help with constipation. If symptoms persist patient to call back to the office, will consider medication such as Amitiza at that time. Return in about 1 year (around 08/05/2016) for physical.

## 2015-08-06 NOTE — Telephone Encounter (Signed)
Done

## 2015-08-06 NOTE — Telephone Encounter (Signed)
Pt stated that Hoyle Sauer was going to give her a prescription for lamisil or a cream like it at her appt today. Nothing was called in and pt did not receive a paper script. Please advise.   [facility] [place]

## 2015-08-06 NOTE — Telephone Encounter (Signed)
Pt.notified

## 2015-08-10 LAB — HEPATIC FUNCTION PANEL
ALK PHOS: 69 IU/L (ref 39–117)
ALT: 12 IU/L (ref 0–32)
AST: 14 IU/L (ref 0–40)
Albumin: 4.3 g/dL (ref 3.5–5.5)
Bilirubin Total: 0.5 mg/dL (ref 0.0–1.2)
Bilirubin, Direct: 0.12 mg/dL (ref 0.00–0.40)
TOTAL PROTEIN: 6.9 g/dL (ref 6.0–8.5)

## 2015-08-10 LAB — BASIC METABOLIC PANEL
BUN / CREAT RATIO: 15 (ref 9–23)
BUN: 12 mg/dL (ref 6–24)
CALCIUM: 9.8 mg/dL (ref 8.7–10.2)
CHLORIDE: 104 mmol/L (ref 97–108)
CO2: 27 mmol/L (ref 18–29)
Creatinine, Ser: 0.8 mg/dL (ref 0.57–1.00)
GFR calc Af Amer: 95 mL/min/{1.73_m2} (ref >59)
GFR, EST NON AFRICAN AMERICAN: 82 mL/min/{1.73_m2} (ref >59)
Glucose: 104 mg/dL — ABNORMAL HIGH (ref 65–99)
POTASSIUM: 4.8 mmol/L (ref 3.5–5.2)
Sodium: 147 mmol/L — ABNORMAL HIGH (ref 134–144)

## 2015-08-10 LAB — LIPID PANEL
Chol/HDL Ratio: 3.7 ratio units (ref 0.0–4.4)
Cholesterol, Total: 198 mg/dL (ref 100–199)
HDL: 53 mg/dL (ref >39)
LDL Calculated: 130 mg/dL — ABNORMAL HIGH (ref 0–99)
Triglycerides: 74 mg/dL (ref 0–149)
VLDL CHOLESTEROL CAL: 15 mg/dL (ref 5–40)

## 2015-08-10 LAB — VITAMIN D 25 HYDROXY (VIT D DEFICIENCY, FRACTURES): Vit D, 25-Hydroxy: 34.9 ng/mL (ref 30.0–100.0)

## 2015-08-10 LAB — TSH: TSH: 2.55 u[IU]/mL (ref 0.450–4.500)

## 2015-08-12 ENCOUNTER — Encounter: Payer: Self-pay | Admitting: Nurse Practitioner

## 2015-09-01 ENCOUNTER — Other Ambulatory Visit: Payer: Self-pay

## 2015-09-01 DIAGNOSIS — K5904 Chronic idiopathic constipation: Secondary | ICD-10-CM

## 2015-09-01 LAB — POC HEMOCCULT BLD/STL (HOME/3-CARD/SCREEN)
Card #2 Fecal Occult Blod, POC: NEGATIVE
Card #3 Fecal Occult Blood, POC: NEGATIVE
Fecal Occult Blood, POC: NEGATIVE

## 2016-01-01 ENCOUNTER — Encounter: Payer: Self-pay | Admitting: Nurse Practitioner

## 2016-01-01 ENCOUNTER — Ambulatory Visit (INDEPENDENT_AMBULATORY_CARE_PROVIDER_SITE_OTHER): Payer: BC Managed Care – PPO | Admitting: Nurse Practitioner

## 2016-01-01 VITALS — BP 130/84 | Temp 99.2°F | Ht 62.5 in | Wt 165.4 lb

## 2016-01-01 DIAGNOSIS — J111 Influenza due to unidentified influenza virus with other respiratory manifestations: Secondary | ICD-10-CM | POA: Diagnosis not present

## 2016-01-01 MED ORDER — OSELTAMIVIR PHOSPHATE 75 MG PO CAPS: 75.0000 mg | ORAL_CAPSULE | Freq: Two times a day (BID) | ORAL | Status: DC

## 2016-01-01 MED ORDER — HYDROCODONE-HOMATROPINE 5-1.5 MG/5ML PO SYRP: 5.0000 mL | ORAL_SOLUTION | ORAL | Status: DC | PRN

## 2016-01-02 ENCOUNTER — Encounter: Payer: Self-pay | Admitting: Nurse Practitioner

## 2016-01-02 NOTE — Progress Notes (Addendum)
Subjective:  Presents for c/o cough, chills and fatigue x 2 days. Fever. Worsening cough last night. Minimal relief with Mucinex. Headache. Slight ear pain. No sore throat. No V/D or abdominal pain. Taking fluids well. Voiding nl. Had a metallic taste in her mouth 2 days ago which resolved. No hemoptysis.   Objective:   BP 130/84 mmHg  Temp(Src) 99.2 F (37.3 C) (Oral)  Ht 5' 2.5" (1.588 m)  Wt 165 lb 6.4 oz (75.025 kg)  BMI 29.75 kg/m2 NAD. Alert, oriented. TMs clear effusion. Pharynx clear. Neck supple with mild anterior adenopathy. Lungs clear. Heart RRR.   Assessment: Influenza  Plan:  Meds ordered this encounter  Medications  . oseltamivir (TAMIFLU) 75 MG capsule    Sig: Take 1 capsule (75 mg total) by mouth 2 (two) times daily.    Dispense:  10 capsule    Refill:  0    Order Specific Question:  Supervising Provider    Answer:  Mikey Kirschner [[id number]]  . HYDROcodone-homatropine (HYCODAN) 5-1.5 MG/5ML syrup    Sig: Take 5 mLs by mouth every 4 (four) hours as needed.    Dispense:  120 mL    Refill:  0    Order Specific Question:  Supervising Provider    Answer:  Mikey Kirschner [[id number]]   Reviewed symptomatic care and warning signs. Call back in 72 hours if no improvement, sooner if worse.

## 2016-02-08 ENCOUNTER — Other Ambulatory Visit: Payer: Self-pay

## 2016-02-08 DIAGNOSIS — Z1231 Encounter for screening mammogram for malignant neoplasm of breast: Secondary | ICD-10-CM

## 2016-02-24 ENCOUNTER — Ambulatory Visit
Admission: RE | Admit: 2016-02-24 | Discharge: 2016-02-24 | Disposition: A | Discharge status: Home / Self Care | Payer: BC Managed Care – PPO | Source: Ambulatory Visit

## 2016-02-24 DIAGNOSIS — Z1231 Encounter for screening mammogram for malignant neoplasm of breast: Secondary | ICD-10-CM

## 2016-09-12 ENCOUNTER — Encounter: Payer: Self-pay | Admitting: Nurse Practitioner

## 2016-09-12 ENCOUNTER — Ambulatory Visit (INDEPENDENT_AMBULATORY_CARE_PROVIDER_SITE_OTHER): Payer: BC Managed Care – PPO | Admitting: Nurse Practitioner

## 2016-09-12 VITALS — BP 102/68 | Ht 62.5 in | Wt 169.0 lb

## 2016-09-12 DIAGNOSIS — Z0000 Encounter for general adult medical examination without abnormal findings: Secondary | ICD-10-CM | POA: Diagnosis not present

## 2016-09-12 MED ORDER — CEPHALEXIN 500 MG PO CAPS
500.0000 mg | ORAL_CAPSULE | Freq: Three times a day (TID) | ORAL | 0 refills | Status: DC
Start: 2016-09-12 — End: 2016-10-25

## 2016-09-12 NOTE — Patient Instructions (Signed)
OTC antihistamine Flonase, rhinocort or rhinocort

## 2016-09-12 NOTE — Progress Notes (Signed)
   Subjective:    Patient ID: Tina [name], female    DOB: 08/25/1956, 60 y.o.   MRN: [id number]  HPI  Presents for her wellness exam. No pelvic pain. Same sexual partner. Regular vision and dental exams. Regular activity. Has mild infection at right ear with difficulty putting her ear ring in.    Review of Systems  Constitutional: Negative for activity change, appetite change, fatigue and fever.  HENT: Negative for dental problem, ear pain, sinus pressure and sore throat.   Respiratory: Negative for cough, chest tightness, shortness of breath and wheezing.   Cardiovascular: Negative for chest pain.  Gastrointestinal: Negative for abdominal distention, abdominal pain, constipation, diarrhea, nausea and vomiting.  Genitourinary: Negative for difficulty urinating, dysuria, enuresis, frequency, genital sores, pelvic pain, urgency and vaginal discharge.       Objective:   Physical Exam  Constitutional: She is oriented to person, place, and time. She appears well-developed. No distress.  HENT:  Left Ear: External ear normal.  Mouth/Throat: Oropharynx is clear and moist.  Slight nodule and tenderness right external ear where piercing is noted.   Neck: Normal range of motion. Neck supple. No tracheal deviation present. No thyromegaly present.  Cardiovascular: Normal rate, regular rhythm and normal heart sounds.  Exam reveals no gallop.   No murmur heard. Pulmonary/Chest: Effort normal and breath sounds normal.  Abdominal: Soft. She exhibits no distension. There is no tenderness.  Genitourinary: Vagina normal. No vaginal discharge found.  Genitourinary Comments: External GU: no rashes or lesions. Vagina: no discharge. Bimanual exam: no tenderness or obvious masses. Rectal exam: no masses or stool for hemoccult.   Musculoskeletal: She exhibits no edema.  Lymphadenopathy:    She has no cervical adenopathy.  Neurological: She is alert and oriented to person, place, and time.  Skin: Skin is  warm and dry. No rash noted.  Psychiatric: She has a normal mood and affect. Her behavior is normal.  Vitals reviewed. Breast exam: areas of density; no masses; axillae no adenopathy.        Assessment & Plan:  Routine general medical examination at a health care facility - Plan: CBC with Differential/Platelet, Basic metabolic panel, Lipid panel, Hepatic function panel, POC Hemoccult Bld/Stl (3-Cd Home Screen)   Meds ordered this encounter  Medications  . cephALEXin (KEFLEX) 500 MG capsule    Sig: Take 1 capsule (500 mg total) by mouth 3 (three) times daily.    Dispense:  21 capsule    Refill:  0    Order Specific Question:   Supervising Provider    Answer:   Mikey Kirschner [[id number]]   Given Rx for Keflex in case infection persists. Continue daily calcium and vitamin D. Return in about 1 year (around 09/12/2017) for physical.

## 2016-10-25 ENCOUNTER — Ambulatory Visit (INDEPENDENT_AMBULATORY_CARE_PROVIDER_SITE_OTHER): Payer: BC Managed Care – PPO | Admitting: Family Medicine

## 2016-10-25 ENCOUNTER — Encounter: Payer: Self-pay | Admitting: Family Medicine

## 2016-10-25 VITALS — BP 112/78 | Temp 98.2°F | Ht 62.5 in | Wt 177.5 lb

## 2016-10-25 DIAGNOSIS — J329 Chronic sinusitis, unspecified: Secondary | ICD-10-CM | POA: Diagnosis not present

## 2016-10-25 DIAGNOSIS — J310 Chronic rhinitis: Secondary | ICD-10-CM

## 2016-10-25 MED ORDER — AMOXICILLIN 500 MG PO CAPS: 500.0000 mg | ORAL_CAPSULE | Freq: Three times a day (TID) | ORAL | 0 refills | Status: DC

## 2016-10-25 NOTE — Progress Notes (Signed)
   Subjective:    Patient ID: Tina [name], female    DOB: 1956-08-17, 60 y.o.   MRN: [id number]  Sinusitis  This is a new problem. The current episode started in the past 7 days. The problem is unchanged. There has been no fever. The pain is moderate. Associated symptoms include congestion, coughing, ear pain, headaches and a sore throat. Past treatments include oral decongestants (advil). The treatment provided no relief.   Patient has no other concerns at this time.   Started thur sore throat and scratchy  Eft ezr discomfort  Saw a little fluid on the ear  Got to feeling dizzy with some ear discofort   Cough not bad til yest   Pos cong and dranage no fever     alkaseltzer cold an docugh advil pfn    Review of Systems  HENT: Positive for congestion, ear pain and sore throat.   Respiratory: Positive for cough.   Neurological: Positive for headaches.       Objective:   Physical Exam  Alert, mild malaise. Hydration good Vitals stable. frontal/ maxillary tenderness evident positive nasal congestion. pharynx normal neck supple  lungs clear/no crackles or wheezes. heart regular in rhythm       Assessment & Plan:  Impression rhinosinusitis Plus element of ear effusion and secondary imbalance issues. likely post viral, discussed with patient. plan antibiotics prescribed. Questions answered. Symptomatic care discussed. warning signs discussed. WSL

## 2016-10-28 LAB — LIPID PANEL
CHOL/HDL RATIO: 3.8 ratio (ref 0.0–4.4)
Cholesterol, Total: 206 mg/dL — ABNORMAL HIGH (ref 100–199)
HDL: 54 mg/dL (ref >39)
LDL Calculated: 132 mg/dL — ABNORMAL HIGH (ref 0–99)
Triglycerides: 101 mg/dL (ref 0–149)
VLDL Cholesterol Cal: 20 mg/dL (ref 5–40)

## 2016-10-28 LAB — HEPATIC FUNCTION PANEL
ALBUMIN: 4 g/dL (ref 3.5–5.5)
ALT: 12 IU/L (ref 0–32)
AST: 14 IU/L (ref 0–40)
Alkaline Phosphatase: 71 IU/L (ref 39–117)
BILIRUBIN TOTAL: 0.4 mg/dL (ref 0.0–1.2)
BILIRUBIN, DIRECT: 0.07 mg/dL (ref 0.00–0.40)
TOTAL PROTEIN: 6.6 g/dL (ref 6.0–8.5)

## 2016-10-28 LAB — CBC WITH DIFFERENTIAL/PLATELET
Basophils Absolute: 0 10*3/uL (ref 0.0–0.2)
Basos: 1 %
EOS (ABSOLUTE): 0.3 10*3/uL (ref 0.0–0.4)
EOS: 4 %
HEMATOCRIT: 40.3 % (ref 34.0–46.6)
Hemoglobin: 13.7 g/dL (ref 11.1–15.9)
Immature Grans (Abs): 0 10*3/uL (ref 0.0–0.1)
Immature Granulocytes: 0 %
LYMPHS ABS: 2.1 10*3/uL (ref 0.7–3.1)
Lymphs: 32 %
MCH: 28.2 pg (ref 26.6–33.0)
MCHC: 34 g/dL (ref 31.5–35.7)
MCV: 83 fL (ref 79–97)
MONOS ABS: 0.4 10*3/uL (ref 0.1–0.9)
Monocytes: 7 %
Neutrophils Absolute: 3.7 10*3/uL (ref 1.4–7.0)
Neutrophils: 56 %
Platelets: 216 10*3/uL (ref 150–379)
RBC: 4.86 x10E6/uL (ref 3.77–5.28)
RDW: 13.2 % (ref 12.3–15.4)
WBC: 6.5 10*3/uL (ref 3.4–10.8)

## 2016-10-28 LAB — BASIC METABOLIC PANEL
BUN / CREAT RATIO: 16 (ref 9–23)
BUN: 12 mg/dL (ref 6–24)
CO2: 28 mmol/L (ref 18–29)
Calcium: 9.3 mg/dL (ref 8.7–10.2)
Chloride: 104 mmol/L (ref 96–106)
Creatinine, Ser: 0.76 mg/dL (ref 0.57–1.00)
GFR, EST AFRICAN AMERICAN: 100 mL/min/{1.73_m2} (ref >59)
GFR, EST NON AFRICAN AMERICAN: 87 mL/min/{1.73_m2} (ref >59)
Glucose: 92 mg/dL (ref 65–99)
POTASSIUM: 5.3 mmol/L — AB (ref 3.5–5.2)
SODIUM: 144 mmol/L (ref 134–144)

## 2016-11-03 ENCOUNTER — Encounter: Payer: Self-pay | Admitting: Nurse Practitioner

## 2017-01-24 ENCOUNTER — Other Ambulatory Visit: Payer: Self-pay | Admitting: Gynecology

## 2017-01-24 ENCOUNTER — Other Ambulatory Visit: Payer: Self-pay | Admitting: Nurse Practitioner

## 2017-01-24 DIAGNOSIS — Z1231 Encounter for screening mammogram for malignant neoplasm of breast: Secondary | ICD-10-CM

## 2017-02-24 ENCOUNTER — Ambulatory Visit
Admission: RE | Admit: 2017-02-24 | Discharge: 2017-02-24 | Disposition: A | Discharge status: Home / Self Care | Payer: BC Managed Care – PPO | Source: Ambulatory Visit | Attending: Nurse Practitioner | Admitting: Nurse Practitioner

## 2017-02-24 DIAGNOSIS — Z1231 Encounter for screening mammogram for malignant neoplasm of breast: Secondary | ICD-10-CM

## 2017-11-07 ENCOUNTER — Ambulatory Visit: Payer: BC Managed Care – PPO | Admitting: Family Medicine

## 2018-01-19 ENCOUNTER — Other Ambulatory Visit: Payer: Self-pay | Admitting: Gynecology

## 2018-01-19 DIAGNOSIS — Z1231 Encounter for screening mammogram for malignant neoplasm of breast: Secondary | ICD-10-CM

## 2018-03-06 ENCOUNTER — Ambulatory Visit
Admission: RE | Admit: 2018-03-06 | Discharge: 2018-03-06 | Disposition: A | Discharge status: Home / Self Care | Payer: BC Managed Care – PPO | Source: Ambulatory Visit | Attending: Gynecology | Admitting: Gynecology

## 2018-03-06 DIAGNOSIS — Z1231 Encounter for screening mammogram for malignant neoplasm of breast: Secondary | ICD-10-CM

## 2018-03-19 ENCOUNTER — Ambulatory Visit: Payer: BC Managed Care – PPO | Admitting: Nurse Practitioner

## 2018-03-19 ENCOUNTER — Encounter: Payer: Self-pay | Admitting: Nurse Practitioner

## 2018-03-19 VITALS — BP 122/86 | Temp 97.6°F | Ht 62.5 in | Wt 179.2 lb

## 2018-03-19 DIAGNOSIS — J019 Acute sinusitis, unspecified: Secondary | ICD-10-CM

## 2018-03-19 DIAGNOSIS — B9689 Other specified bacterial agents as the cause of diseases classified elsewhere: Secondary | ICD-10-CM

## 2018-03-19 MED ORDER — AMOXICILLIN 500 MG PO CAPS: 500.0000 mg | ORAL_CAPSULE | Freq: Three times a day (TID) | ORAL | 0 refills | Status: DC

## 2018-03-19 NOTE — Progress Notes (Signed)
Subjective: Presents for complaints of head congestion for the past 6 days.  Fever and sore throat have resolved.  Facial area headache.  Spells of coughing mainly at night occasionally producing yellow mucus.  No wheezing.  Some left ear pain.  Objective:   BP 122/86   Temp 97.6 F (36.4 C) (Oral)   Ht 5' 2.5" (1.588 m)   Wt 179 lb 3.2 oz (81.3 kg)   BMI 32.25 kg/m  NAD.  Alert, oriented.  TMs retracted bilaterally more on the right.  Pharynx injected with PND noted.  Neck supple with mild soft anterior adenopathy.  Lungs clear.  Heart regular rate and rhythm.  Assessment:  Acute bacterial rhinosinusitis    Plan:   Meds ordered this encounter  Medications  . amoxicillin (AMOXIL) 500 MG capsule    Sig: Take 1 capsule (500 mg total) by mouth 3 (three) times daily.    Dispense:  30 capsule    Refill:  0    Order Specific Question:   Supervising Provider    Answer:   Mikey Kirschner [[id number]]   Continue OTC meds as directed for symptomatic care.  Warning signs reviewed.  Call back by the end of the week if no improvement, sooner if worse.

## 2018-03-30 ENCOUNTER — Ambulatory Visit: Payer: BC Managed Care – PPO | Admitting: Family Medicine

## 2018-03-30 ENCOUNTER — Telehealth: Payer: Self-pay | Admitting: Family Medicine

## 2018-03-30 MED ORDER — BENZONATATE 100 MG PO CAPS: 100.0000 mg | ORAL_CAPSULE | Freq: Three times a day (TID) | ORAL | 0 refills | Status: DC | PRN

## 2018-03-30 MED ORDER — CEFDINIR 300 MG PO CAPS: 300.0000 mg | ORAL_CAPSULE | Freq: Two times a day (BID) | ORAL | 0 refills | Status: DC

## 2018-03-30 NOTE — Telephone Encounter (Signed)
Prescription sent electronically to pharmacy. Patient notified. 

## 2018-03-30 NOTE — Telephone Encounter (Signed)
Patient seen Tina [name] on 03/19/18 for rhinonsinusitis.  She was given Rx for amoxicillin.  She said her congested cough has come back, often uncontrolled.  She wants to know what is recommended?  [facility] [address] [facility] [place], [place]

## 2018-03-30 NOTE — Telephone Encounter (Signed)
Patient called stating she is leaving the office and wanted to leave a different number to call [phone]

## 2018-03-30 NOTE — Telephone Encounter (Signed)
omnicef 300 bid ten d  Tess perle 100 mg 30 one tid prn cough

## 2018-07-17 ENCOUNTER — Telehealth: Payer: Self-pay | Admitting: Family Medicine

## 2018-07-17 DIAGNOSIS — Z0000 Encounter for general adult medical examination without abnormal findings: Secondary | ICD-10-CM

## 2018-07-17 NOTE — Telephone Encounter (Signed)
Had Hepatic fx panel,lipid,Bmet,cbc 10/27/2016.

## 2018-07-17 NOTE — Telephone Encounter (Signed)
Patient has an appt set for Oct 29th, 2019 for yearly physical with Ria Comment, patient is wondering if any blood work needed if so she would like order to be placed ahead of time, please advise an inform pt.

## 2018-07-19 NOTE — Telephone Encounter (Signed)
Rep same 

## 2018-07-19 NOTE — Telephone Encounter (Signed)
Left message to return call 

## 2018-07-27 ENCOUNTER — Ambulatory Visit: Payer: BC Managed Care – PPO | Admitting: Family Medicine

## 2018-07-27 ENCOUNTER — Encounter: Payer: Self-pay | Admitting: Family Medicine

## 2018-07-27 VITALS — BP 122/84 | Temp 98.6°F | Ht 62.5 in | Wt 178.2 lb

## 2018-07-27 DIAGNOSIS — J329 Chronic sinusitis, unspecified: Secondary | ICD-10-CM

## 2018-07-27 DIAGNOSIS — J310 Chronic rhinitis: Secondary | ICD-10-CM

## 2018-07-27 MED ORDER — HYDROCODONE-HOMATROPINE 5-1.5 MG/5ML PO SYRP: ORAL_SOLUTION | ORAL | 0 refills | Status: DC

## 2018-07-27 MED ORDER — CEFDINIR 300 MG PO CAPS: ORAL_CAPSULE | ORAL | 0 refills | Status: DC

## 2018-07-27 NOTE — Progress Notes (Signed)
   Subjective:    Patient ID: Tina [name], female    DOB: 1956-09-30, 62 y.o.   MRN: [id number]  Cough  This is a new problem. The current episode started in the past 7 days. The cough is non-productive. Associated symptoms include headaches and rhinorrhea. Treatments tried: steam/eucolytus; alka seltzer cold, and mucinex cough syrup  The treatment provided no relief.   Started Monday  Thought allergies  Felt like it at first  Sore throat and then con   Then stuffy nose  als  sleter cold progress ive  Cough is bad not prod and ontrolle   whoe thing hurting   Not miucj prodictove a all       Review of Systems  HENT: Positive for rhinorrhea.   Respiratory: Positive for cough.   Neurological: Positive for headaches.       Objective:   Physical Exam  Alert, mild malaise. Hydration good Vitals stable. frontal/ maxillary tenderness evident positive nasal congestion. pharynx normal neck supple  lungs clear/no crackles or wheezes. heart regular in rhythm Plus substantial deep bronchial cough shortness      Assessment & Plan:  Impression rhinosinusitis/bronchitis, nocturnal headache, likely post viral, discussed with patient. plan antibiotics prescribed. Questions answered. Symptomatic care discussed. warning signs discussed. WSL

## 2018-07-28 LAB — HEPATIC FUNCTION PANEL
ALT: 15 IU/L (ref 0–32)
AST: 16 IU/L (ref 0–40)
Albumin: 4.5 g/dL (ref 3.6–4.8)
Alkaline Phosphatase: 61 IU/L (ref 39–117)
BILIRUBIN TOTAL: 0.5 mg/dL (ref 0.0–1.2)
BILIRUBIN, DIRECT: 0.14 mg/dL (ref 0.00–0.40)
Total Protein: 7.1 g/dL (ref 6.0–8.5)

## 2018-07-28 LAB — LIPID PANEL
CHOLESTEROL TOTAL: 163 mg/dL (ref 100–199)
Chol/HDL Ratio: 3.5 ratio (ref 0.0–4.4)
HDL: 46 mg/dL (ref >39)
LDL Calculated: 98 mg/dL (ref 0–99)
TRIGLYCERIDES: 97 mg/dL (ref 0–149)
VLDL Cholesterol Cal: 19 mg/dL (ref 5–40)

## 2018-07-28 LAB — CBC WITH DIFFERENTIAL/PLATELET
BASOS ABS: 0 10*3/uL (ref 0.0–0.2)
BASOS: 1 %
EOS (ABSOLUTE): 0.5 10*3/uL — ABNORMAL HIGH (ref 0.0–0.4)
Eos: 6 %
HEMATOCRIT: 41.6 % (ref 34.0–46.6)
HEMOGLOBIN: 13.6 g/dL (ref 11.1–15.9)
Immature Grans (Abs): 0 10*3/uL (ref 0.0–0.1)
Immature Granulocytes: 0 %
LYMPHS ABS: 2.9 10*3/uL (ref 0.7–3.1)
Lymphs: 38 %
MCH: 28.2 pg (ref 26.6–33.0)
MCHC: 32.7 g/dL (ref 31.5–35.7)
MCV: 86 fL (ref 79–97)
MONOCYTES: 6 %
Monocytes Absolute: 0.5 10*3/uL (ref 0.1–0.9)
NEUTROS ABS: 3.8 10*3/uL (ref 1.4–7.0)
Neutrophils: 49 %
PLATELETS: 257 10*3/uL (ref 150–450)
RBC: 4.82 x10E6/uL (ref 3.77–5.28)
RDW: 13 % (ref 12.3–15.4)
WBC: 7.7 10*3/uL (ref 3.4–10.8)

## 2018-07-28 LAB — BASIC METABOLIC PANEL
BUN / CREAT RATIO: 16 (ref 12–28)
BUN: 14 mg/dL (ref 8–27)
CHLORIDE: 106 mmol/L (ref 96–106)
CO2: 25 mmol/L (ref 20–29)
Calcium: 9.9 mg/dL (ref 8.7–10.3)
Creatinine, Ser: 0.85 mg/dL (ref 0.57–1.00)
GFR calc non Af Amer: 75 mL/min/{1.73_m2} (ref >59)
GFR, EST AFRICAN AMERICAN: 86 mL/min/{1.73_m2} (ref >59)
Glucose: 90 mg/dL (ref 65–99)
POTASSIUM: 4.8 mmol/L (ref 3.5–5.2)
Sodium: 145 mmol/L — ABNORMAL HIGH (ref 134–144)

## 2018-08-28 ENCOUNTER — Ambulatory Visit (INDEPENDENT_AMBULATORY_CARE_PROVIDER_SITE_OTHER): Payer: BC Managed Care – PPO | Admitting: Family Medicine

## 2018-08-28 ENCOUNTER — Ambulatory Visit (HOSPITAL_COMMUNITY)
Admission: RE | Admit: 2018-08-28 | Discharge: 2018-08-28 | Disposition: A | Discharge status: Home / Self Care | Payer: BC Managed Care – PPO | Source: Ambulatory Visit | Attending: Family Medicine | Admitting: Family Medicine

## 2018-08-28 ENCOUNTER — Encounter: Payer: Self-pay | Admitting: Family Medicine

## 2018-08-28 VITALS — BP 122/82 | Ht 62.5 in | Wt 177.0 lb

## 2018-08-28 DIAGNOSIS — M47816 Spondylosis without myelopathy or radiculopathy, lumbar region: Secondary | ICD-10-CM | POA: Diagnosis not present

## 2018-08-28 DIAGNOSIS — M545 Low back pain, unspecified: Secondary | ICD-10-CM

## 2018-08-28 DIAGNOSIS — Z0001 Encounter for general adult medical examination with abnormal findings: Secondary | ICD-10-CM | POA: Diagnosis not present

## 2018-08-28 MED ORDER — ZOSTER VAC RECOMB ADJUVANTED 50 MCG/0.5ML IM SUSR: 0.5000 mL | Freq: Once | INTRAMUSCULAR | 1 refills | Status: AC

## 2018-08-28 MED ORDER — ETODOLAC 400 MG PO TABS: 400.0000 mg | ORAL_TABLET | Freq: Two times a day (BID) | ORAL | 0 refills | Status: DC

## 2018-08-28 NOTE — Progress Notes (Signed)
Subjective:    Patient ID: Tina [name], female    DOB: December 15, 1955, 62 y.o.   MRN: [id number]  HPI  The patient comes in today for a wellness visit.  A review of their health history was completed.  A review of medications was also completed.  Any needed refills; no  Eating habits: eating healthy  Falls/  MVA accidents in past few months: fall when hiking last weekend, tripped over a rock-no injuries  Regular exercise: hiking, walking  Specialist pt sees on regular basis: no  Preventative health issues were discussed.   Additional concerns: back pain and discuss recent labs  Low back pain x 3-4 weeks. No known injury. Pain is worse on the right side, described as sharp. Reports right-side acheing pain is constant but the sharp pain comes and goes. Standing is worse. Denies sciatica symptoms. Denies any numbness or tingling, no weakness in either lower extremity. No loss of bowel or bladder function. Has been using heat and ice. Taking ibuprofen 800 mg 2-3 times per day, does not help very much. Ice helped the most.   Reports last had low back pain in 2016, but resolved quickly. Has never had back pain last this long.   Colonoscopy is scheduled in November 2019 Gets regular vision and dental exams.   Past Medical History:  Diagnosis Date  . Fibroid 2007   leiomyomata    Review of Systems  Constitutional: Negative for chills, fatigue, fever and unexpected weight change.  HENT: Negative for congestion, ear pain, sinus pressure, sinus pain and sore throat.   Eyes: Negative for discharge.  Respiratory: Negative for cough, shortness of breath and wheezing.   Cardiovascular: Negative for chest pain and leg swelling.  Gastrointestinal: Negative for abdominal pain, blood in stool, constipation, diarrhea, nausea and vomiting.  Genitourinary: Negative for difficulty urinating, hematuria, pelvic pain, vaginal bleeding and vaginal discharge.  Musculoskeletal: Positive for back  pain (See HPI).  Skin: Negative for color change.  Neurological: Negative for dizziness, weakness, light-headedness and headaches.  Psychiatric/Behavioral: Negative for dysphoric mood and suicidal ideas.  All other systems reviewed and are negative.      Objective:   Physical Exam  Constitutional: She is oriented to person, place, and time. She appears well-developed and well-nourished. No distress.  HENT:  Head: Normocephalic and atraumatic.  Right Ear: Tympanic membrane normal.  Left Ear: Tympanic membrane normal.  Nose: Nose normal.  Mouth/Throat: Uvula is midline and oropharynx is clear and moist.  Eyes: Conjunctivae are normal. Right eye exhibits no discharge. Left eye exhibits no discharge.  Neck: Neck supple. No thyromegaly present.  Cardiovascular: Normal rate, regular rhythm and normal heart sounds.  No murmur heard. Pulmonary/Chest: Effort normal and breath sounds normal. No respiratory distress. She has no wheezes. Right breast exhibits no inverted nipple, no mass, no nipple discharge, no skin change and no tenderness. Left breast exhibits no inverted nipple, no mass, no nipple discharge, no skin change and no tenderness.  Abdominal: Soft. Bowel sounds are normal. She exhibits no distension and no mass. There is no tenderness.  Genitourinary: Vagina normal. There is no rash, tenderness or lesion on the right labia. There is no rash, tenderness or lesion on the left labia.  Genitourinary Comments: Total hysterectomy.  Musculoskeletal: She exhibits no edema, tenderness or deformity.  Back: Negative for tenderness along spinal processes. No neck pain. No tenderness to lower back musculature on palpation. Negative ipsilateral and contralateral SLR. Patellar reflexes brisk bilaterally. Sensation and strength intact  to bilateral lower extremities.   Lymphadenopathy:    She has no cervical adenopathy.  Neurological: She is alert and oriented to person, place, and time. Coordination  normal.  Skin: Skin is warm and dry.  Psychiatric: She has a normal mood and affect.  Nursing note and vitals reviewed.      Assessment & Plan:  1. Encounter for well adult exam with abnormal findings Adult wellness-complete.wellness physical was conducted today. Importance of diet and exercise were discussed in detail.  In addition to this a discussion regarding safety was also covered. We also reviewed over immunizations and gave recommendations regarding current immunization needed for age.   -Shingrix rx given to pt. In addition to this additional areas were also touched on including: Preventative health exams needed:  Colonoscopy is due: states it is scheduled for November 2019 Mammogram is UTD, yearly Pap Smear: N/A hysterectomy  Recent lab work reviewed with patient.  Patient was advised yearly wellness exam  2. Acute right-sided low back pain without sciatica - Plan: DG Lumbar Spine Complete Given duration of almost 1 month will go ahead and get a xray of the lumbar spine. No red flags. Rx strength antiinflammatory provided, take with food. Hand out given for back stretches to do as pt is able. Will f/u based on results of xray. Schedule f/u in 4 weeks, may cancel if symptoms improve.   Dr. Mickie Hillier was consulted on this case and is in agreement with the above treatment plan.

## 2018-08-28 NOTE — Patient Instructions (Signed)

## 2018-09-10 ENCOUNTER — Telehealth: Payer: Self-pay | Admitting: Family Medicine

## 2018-09-10 NOTE — Telephone Encounter (Signed)
[facility] Dr. Lorie Apley office is needing pts most recent labs faxed over for appt she has on 09/11/18. Fax number is [phone]. CB# [phone].

## 2018-09-24 ENCOUNTER — Ambulatory Visit: Payer: BC Managed Care – PPO | Admitting: Family Medicine

## 2018-10-08 ENCOUNTER — Encounter: Payer: Self-pay | Admitting: Family Medicine

## 2019-04-08 ENCOUNTER — Other Ambulatory Visit: Payer: Self-pay | Admitting: Family Medicine

## 2019-04-08 DIAGNOSIS — Z1231 Encounter for screening mammogram for malignant neoplasm of breast: Secondary | ICD-10-CM

## 2019-05-20 ENCOUNTER — Ambulatory Visit
Admission: RE | Admit: 2019-05-20 | Discharge: 2019-05-20 | Disposition: A | Discharge status: Home / Self Care | Payer: BC Managed Care – PPO | Source: Ambulatory Visit | Attending: Family Medicine | Admitting: Family Medicine

## 2019-05-20 DIAGNOSIS — Z1231 Encounter for screening mammogram for malignant neoplasm of breast: Secondary | ICD-10-CM

## 2019-07-30 ENCOUNTER — Encounter: Payer: Self-pay | Admitting: Gynecology

## 2019-10-16 ENCOUNTER — Other Ambulatory Visit: Payer: Self-pay

## 2019-10-16 ENCOUNTER — Ambulatory Visit: Payer: BC Managed Care – PPO | Attending: Internal Medicine

## 2019-10-16 DIAGNOSIS — Z20822 Contact with and (suspected) exposure to covid-19: Secondary | ICD-10-CM

## 2019-10-18 LAB — NOVEL CORONAVIRUS, NAA: SARS-CoV-2, NAA: NOT DETECTED

## 2019-11-25 ENCOUNTER — Other Ambulatory Visit: Payer: Self-pay

## 2019-11-25 DIAGNOSIS — Z20822 Contact with and (suspected) exposure to covid-19: Secondary | ICD-10-CM

## 2019-11-26 LAB — NOVEL CORONAVIRUS, NAA: SARS-CoV-2, NAA: NOT DETECTED

## 2019-12-05 ENCOUNTER — Encounter: Payer: Self-pay | Admitting: Family Medicine

## 2020-01-08 ENCOUNTER — Telehealth: Payer: Self-pay | Admitting: Family Medicine

## 2020-01-08 DIAGNOSIS — Z0000 Encounter for general adult medical examination without abnormal findings: Secondary | ICD-10-CM

## 2020-01-08 DIAGNOSIS — Z79899 Other long term (current) drug therapy: Secondary | ICD-10-CM

## 2020-01-08 NOTE — Telephone Encounter (Signed)
Last labs 07/2018: Lipid, Liver, Met 7 and CBC

## 2020-01-08 NOTE — Telephone Encounter (Signed)
Pt has CPE on 4/6 and would like lab work done before appt.

## 2020-01-10 NOTE — Telephone Encounter (Signed)
Pt returned call and verbalized understanding  

## 2020-01-10 NOTE — Telephone Encounter (Signed)
Lab orders placed. Left message to return call  

## 2020-01-10 NOTE — Telephone Encounter (Signed)
Rep same plus vit d

## 2020-01-20 ENCOUNTER — Other Ambulatory Visit: Payer: Self-pay

## 2020-01-20 DIAGNOSIS — Z20822 Contact with and (suspected) exposure to covid-19: Secondary | ICD-10-CM

## 2020-01-21 LAB — SARS-COV-2, NAA 2 DAY TAT

## 2020-01-21 LAB — NOVEL CORONAVIRUS, NAA: SARS-CoV-2, NAA: NOT DETECTED

## 2020-01-28 ENCOUNTER — Telehealth: Payer: BC Managed Care – PPO | Admitting: Physician Assistant

## 2020-01-28 DIAGNOSIS — R21 Rash and other nonspecific skin eruption: Secondary | ICD-10-CM | POA: Diagnosis not present

## 2020-01-28 LAB — VITAMIN D 25 HYDROXY (VIT D DEFICIENCY, FRACTURES): Vit D, 25-Hydroxy: 33.9 ng/mL (ref 30.0–100.0)

## 2020-01-28 LAB — LIPID PANEL
Chol/HDL Ratio: 3 ratio (ref 0.0–4.4)
Cholesterol, Total: 159 mg/dL (ref 100–199)
HDL: 53 mg/dL (ref >39)
LDL Chol Calc (NIH): 95 mg/dL (ref 0–99)
Triglycerides: 56 mg/dL (ref 0–149)
VLDL Cholesterol Cal: 11 mg/dL (ref 5–40)

## 2020-01-28 LAB — CBC WITH DIFFERENTIAL/PLATELET
Basophils Absolute: 0.1 10*3/uL (ref 0.0–0.2)
Basos: 1 %
EOS (ABSOLUTE): 0.3 10*3/uL (ref 0.0–0.4)
Eos: 4 %
Hematocrit: 41.2 % (ref 34.0–46.6)
Hemoglobin: 14.1 g/dL (ref 11.1–15.9)
Immature Grans (Abs): 0 10*3/uL (ref 0.0–0.1)
Immature Granulocytes: 0 %
Lymphocytes Absolute: 2 10*3/uL (ref 0.7–3.1)
Lymphs: 27 %
MCH: 28.8 pg (ref 26.6–33.0)
MCHC: 34.2 g/dL (ref 31.5–35.7)
MCV: 84 fL (ref 79–97)
Monocytes Absolute: 0.5 10*3/uL (ref 0.1–0.9)
Monocytes: 7 %
Neutrophils Absolute: 4.6 10*3/uL (ref 1.4–7.0)
Neutrophils: 61 %
Platelets: 239 10*3/uL (ref 150–450)
RBC: 4.89 x10E6/uL (ref 3.77–5.28)
RDW: 13.2 % (ref 11.7–15.4)
WBC: 7.6 10*3/uL (ref 3.4–10.8)

## 2020-01-28 LAB — HEPATIC FUNCTION PANEL
ALT: 14 IU/L (ref 0–32)
AST: 19 IU/L (ref 0–40)
Albumin: 4.1 g/dL (ref 3.8–4.8)
Alkaline Phosphatase: 78 IU/L (ref 39–117)
Bilirubin Total: 0.3 mg/dL (ref 0.0–1.2)
Bilirubin, Direct: 0.1 mg/dL (ref 0.00–0.40)
Total Protein: 6.7 g/dL (ref 6.0–8.5)

## 2020-01-28 LAB — BASIC METABOLIC PANEL
BUN/Creatinine Ratio: 12 (ref 12–28)
BUN: 10 mg/dL (ref 8–27)
CO2: 24 mmol/L (ref 20–29)
Calcium: 9.5 mg/dL (ref 8.7–10.3)
Chloride: 106 mmol/L (ref 96–106)
Creatinine, Ser: 0.84 mg/dL (ref 0.57–1.00)
GFR calc Af Amer: 86 mL/min/{1.73_m2} (ref >59)
GFR calc non Af Amer: 75 mL/min/{1.73_m2} (ref >59)
Glucose: 91 mg/dL (ref 65–99)
Potassium: 4.8 mmol/L (ref 3.5–5.2)
Sodium: 145 mmol/L — ABNORMAL HIGH (ref 134–144)

## 2020-01-28 MED ORDER — FAMOTIDINE 20 MG PO TABS: 20.0000 mg | ORAL_TABLET | Freq: Two times a day (BID) | ORAL | 0 refills | Status: DC

## 2020-01-28 MED ORDER — PREDNISONE 10 MG (21) PO TBPK: ORAL_TABLET | Freq: Every day | ORAL | 0 refills | Status: DC

## 2020-01-28 NOTE — Progress Notes (Signed)
E Visit for Rash  We are sorry that you are not feeling well. Here is how we plan to help!  Based on what you've shared with me it looks like you have a rash called Hives. Usually this is caused by something that you are allergic to in your environment. I have prescribed a medication called Prednisone to have quell the immune reaction you are having. I have also prescribed Pepcid to help with the itching. You may also take over the counter Benadryl for the itching.    HOME CARE:   Take cool showers and avoid direct sunlight.  Apply cool compress or wet dressings.  Take a bath in an oatmeal bath.  Sprinkle content of one Aveeno packet under running faucet with comfortably warm water.  Bathe for 15-20 minutes, 1-2 times daily.  Pat dry with a towel. Do not rub the rash.  Use hydrocortisone cream.  Take an antihistamine like Benadryl for widespread rashes that itch.  The adult dose of Benadryl is 25-50 mg by mouth 4 times daily.  Caution:  This type of medication may cause sleepiness.  Do not drink alcohol, drive, or operate dangerous machinery while taking antihistamines.  Do not take these medications if you have prostate enlargement.  Read package instructions thoroughly on all medications that you take.  GET HELP RIGHT AWAY IF:   Symptoms don't go away after treatment.  Severe itching that persists.  If you rash spreads or swells.  If you rash begins to smell.  If it blisters and opens or develops a yellow-brown crust.  You develop a fever.  You have a sore throat.  You become short of breath.  MAKE SURE YOU:  Understand these instructions. Will watch your condition. Will get help right away if you are not doing well or get worse.  Thank you for choosing an e-visit. Your e-visit answers were reviewed by a board certified advanced clinical practitioner to complete your personal care plan. Depending upon the condition, your plan could have included both over the counter  or prescription medications. Please review your pharmacy choice. Be sure that the pharmacy you have chosen is open so that you can pick up your prescription now.  If there is a problem you may message your provider in [place] to have the prescription routed to another pharmacy. Your safety is important to [place]. If you have drug allergies check your prescription carefully.  For the next 24 hours, you can use MyChart to ask questions about today's visit, request a non-urgent call back, or ask for a work or school excuse from your e-visit provider. You will get an email in the next two days asking about your experience. I hope that your e-visit has been valuable and will speed your recovery.   Approximately 5 minutes was spent documenting and reviewing patient's chart.

## 2020-01-29 ENCOUNTER — Encounter: Payer: Self-pay | Admitting: Family Medicine

## 2020-01-29 ENCOUNTER — Other Ambulatory Visit: Payer: Self-pay | Admitting: Family

## 2020-01-29 MED ORDER — PREDNISONE 10 MG (21) PO TBPK: ORAL_TABLET | ORAL | 0 refills | Status: DC

## 2020-01-29 NOTE — Progress Notes (Signed)
Call from patient that pharmacy did not get prednisone. RX on print. Will resend.

## 2020-01-30 MED ORDER — TRIAMCINOLONE ACETONIDE 0.1 % EX CREA: TOPICAL_CREAM | CUTANEOUS | 0 refills | Status: DC

## 2020-02-03 ENCOUNTER — Telehealth: Payer: Self-pay | Admitting: *Deleted

## 2020-02-03 ENCOUNTER — Other Ambulatory Visit: Payer: Self-pay | Admitting: *Deleted

## 2020-02-03 ENCOUNTER — Other Ambulatory Visit: Payer: Self-pay

## 2020-02-03 ENCOUNTER — Ambulatory Visit: Payer: BC Managed Care – PPO | Admitting: Family Medicine

## 2020-02-03 ENCOUNTER — Encounter: Payer: Self-pay | Admitting: Family Medicine

## 2020-02-03 VITALS — BP 140/82 | Temp 97.8°F | Wt 174.8 lb

## 2020-02-03 DIAGNOSIS — R21 Rash and other nonspecific skin eruption: Secondary | ICD-10-CM

## 2020-02-03 MED ORDER — MOMETASONE FUROATE 0.1 % EX CREA: TOPICAL_CREAM | CUTANEOUS | 0 refills | Status: AC

## 2020-02-03 MED ORDER — TRIAMCINOLONE ACETONIDE 0.1 % EX CREA: TOPICAL_CREAM | CUTANEOUS | 0 refills | Status: AC

## 2020-02-03 NOTE — Telephone Encounter (Signed)
Elocon cr 0.1 per cent 90 g apply once daily to rash

## 2020-02-03 NOTE — Telephone Encounter (Signed)
Pt notified and med sent to pharm.

## 2020-02-03 NOTE — Telephone Encounter (Signed)
Left message to return call to discuss and find out which pharm to send med to

## 2020-02-03 NOTE — Progress Notes (Signed)
   Subjective:    Patient ID: Tina [name], female    DOB: 03/16/1956, 64 y.o.   MRN: [id number]  Rash This is a new problem. Episode onset: 10 days ago. The problem has been gradually worsening since onset. Location: has affected all over her body. The rash is characterized by itchiness. Past treatments include anti-itch cream, antibiotic cream and antihistamine (Patient has been using otc benadry, cortisone cream and calamine lotion with little relief.). The treatment provided mild relief.    Pt having rash  croppig up multiple area over the body   Over tend days   Rash itches bad miserable  Pt can not think of any potential exposures or itching  Pt did not hike or get out  No known exposure  Pt notes  No known exposure  No outdoor atiities  Was told potential   Due to have number two otomorrow    Was rxed triamconolone  Mostly gone ;  Review of Systems  Skin: Positive for rash.      Objective:   Physical Exam  Alert active no acute distress lungs clear heart rate and rhythm distinct erythematous patchy rash in multiple areas of the body left axillary region beltline anterior abdomen anterior left thigh.  Erythematous.  Completely blanchable.  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  Understandably aggravating to patient.  Etiology unclear to patient and myself also.  Trying harder to avoid systemic steroid because of second Covid shot tomorrow.  Potential etiologies discussed.  Maintain topical steroid therapy.  Maintain famotidine.  And as needed Benadryl.  We will go ahead and do dermatology referral in case this turns into more of a chronic situation.

## 2020-02-03 NOTE — Telephone Encounter (Signed)
Patient states that pharmacy told her her insurance will not cover another script for the kenalog cream until 02/16/2020 since she got it filled last week    Please advise- do you recommend something different.

## 2020-02-04 ENCOUNTER — Ambulatory Visit: Payer: BC Managed Care – PPO | Admitting: Dermatology

## 2020-02-04 ENCOUNTER — Encounter: Payer: BC Managed Care – PPO | Admitting: Family Medicine

## 2020-02-10 ENCOUNTER — Encounter: Payer: Self-pay | Admitting: Family Medicine

## 2020-03-10 ENCOUNTER — Telehealth: Payer: Self-pay | Admitting: Family Medicine

## 2020-03-10 NOTE — Telephone Encounter (Signed)
Patient is asking if there is any other diagnosis that we can use for her Vitamin D labs.  She said that the insurance said Z00.00 Wellness but I told her that is what we used.

## 2020-03-10 NOTE — Telephone Encounter (Signed)
Please advise. Thank you

## 2020-03-11 NOTE — Telephone Encounter (Signed)
R53.83- ICD 10 code for fatigue

## 2020-03-11 NOTE — Telephone Encounter (Signed)
Please try unspecified fatigue for Vitamin D level. Thanks.

## 2020-03-12 ENCOUNTER — Telehealth: Payer: Self-pay | Admitting: Family Medicine

## 2020-03-12 NOTE — Telephone Encounter (Signed)
I called [facility] and asked them to add Icd 10 code R53.83 Fatigue to the Vit D order and refile to see if insurance would cover. I called and left the patient a message stating to allow 30 days for reprocessing.

## 2020-04-07 ENCOUNTER — Other Ambulatory Visit: Payer: Self-pay | Admitting: Family Medicine

## 2020-04-07 DIAGNOSIS — Z1231 Encounter for screening mammogram for malignant neoplasm of breast: Secondary | ICD-10-CM

## 2020-04-20 ENCOUNTER — Other Ambulatory Visit: Payer: Self-pay

## 2020-04-20 DIAGNOSIS — Z20822 Contact with and (suspected) exposure to covid-19: Secondary | ICD-10-CM

## 2020-04-21 LAB — NOVEL CORONAVIRUS, NAA: SARS-CoV-2, NAA: NOT DETECTED

## 2020-04-21 LAB — SARS-COV-2, NAA 2 DAY TAT

## 2020-05-15 ENCOUNTER — Encounter: Payer: Self-pay | Admitting: Nurse Practitioner

## 2020-05-15 ENCOUNTER — Other Ambulatory Visit: Payer: Self-pay

## 2020-05-15 ENCOUNTER — Ambulatory Visit (INDEPENDENT_AMBULATORY_CARE_PROVIDER_SITE_OTHER): Payer: BC Managed Care – PPO | Admitting: Nurse Practitioner

## 2020-05-15 VITALS — BP 118/72 | Temp 98.0°F | Ht 62.5 in | Wt 181.6 lb

## 2020-05-15 DIAGNOSIS — Z01419 Encounter for gynecological examination (general) (routine) without abnormal findings: Secondary | ICD-10-CM

## 2020-05-15 DIAGNOSIS — L608 Other nail disorders: Secondary | ICD-10-CM | POA: Insufficient documentation

## 2020-05-15 DIAGNOSIS — Z0000 Encounter for general adult medical examination without abnormal findings: Secondary | ICD-10-CM

## 2020-05-15 DIAGNOSIS — B351 Tinea unguium: Secondary | ICD-10-CM

## 2020-05-15 NOTE — Progress Notes (Signed)
Subjective:    Patient ID: Tina [name], female    DOB: Jul 02, 1956, 64 y.o.   MRN: [id number]  HPI presents for annual exam.   Married and lives with husband.  Has two children that live out of state. Pt works at [employer] and enjoys working.  Diet:  Tries to eat healthy Exercise:  4-5 days week at [employer], cardio and weight lifting, 30-45 min day.     Review of Systems  Constitutional: Negative for activity change, appetite change, fatigue and fever.  Eyes:       Saw eye doctor around first of July 2021.  No changes to vision.   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   Respiratory: Negative for cough, chest tightness, shortness of breath and wheezing.   Cardiovascular: Negative for chest pain, palpitations and leg swelling.  Gastrointestinal: Positive for constipation. Negative for abdominal pain, blood in stool, nausea and vomiting.       Takes herbal supplement for constipation and "stomach issues" which helps  Genitourinary: Negative for difficulty urinating, dyspareunia, dysuria, enuresis, frequency, genital sores, hematuria, pelvic pain, urgency, vaginal discharge and vaginal pain.  Skin:       Saw dermatologist for skin exam at first of July 2021 for skin cancer screening.   Has had toenail fungus on bilateral toes for "years" and feet are dry, scaly, and itches.  Pt has been using Lotrimin with no relief.     Depression screen [facility] 2/9 05/15/2020 08/28/2018  Decreased Interest 0 0  Down, Depressed, Hopeless 0 0  PHQ - 2 Score 0 0      Objective:   Physical Exam Vitals reviewed.  Constitutional:      Appearance: Normal appearance.  Neck:     Comments: Thyroid non tender. No mass or goiter noted.  Cardiovascular:     Rate and Rhythm: Normal rate and regular rhythm.     Pulses: Normal pulses.     Heart sounds: Normal heart sounds. No murmur heard.  No friction rub. No  gallop.   Pulmonary:     Effort: Pulmonary effort is normal. No respiratory distress.     Breath sounds: Normal breath sounds.  Chest:     Breasts:        Right: No inverted nipple, mass, skin change or tenderness.        Left: No inverted nipple, mass, skin change or tenderness.  Abdominal:     General: There is no distension.     Palpations: Abdomen is soft. There is no mass.     Tenderness: There is no abdominal tenderness.  Genitourinary:    Comments: External GU with no rashes or lesions.  Vagina pink mucosa, no lesions.   Bimanual exam with no tenderness or obvious masses.   Musculoskeletal:     Cervical back: Normal range of motion.  Lymphadenopathy:     Cervical: No cervical adenopathy.     Upper Body:     Right upper body: No supraclavicular, axillary or pectoral adenopathy.     Left upper body: No supraclavicular, axillary or pectoral adenopathy.  Skin:    General: Skin is warm and dry.  Neurological:     Mental Status: She is alert and oriented to person, place, and time.  Psychiatric:        Mood and Affect: Mood normal.        Behavior: Behavior  normal.        Thought Content: Thought content normal.        Judgment: Judgment normal.       Today's Vitals   05/15/20 1423  BP: 118/72  Temp: 98 F (36.7 C)  TempSrc: Oral  Weight: 82.4 kg  Height: 5' 2.5" (1.588 m)   Body mass index is 32.69 kg/m.  See labs 01/27/20.      Assessment & Plan:  Well woman exam  Onychomycosis   Gave information for patient questions on OTC supplements that she can research.  websites you can visit for OTC supplement information:  USP.org [email or web address]  For dry feet use CeraVe lotion on feet.  Use after shower.   Pt wants to OTC topical tea tree oil and undecylenic acid product first.  If toenail fungus not improved after several months and you want to switch to oral medication, please call office.    Return in about 1 year (around 05/15/2021) for physical.

## 2020-05-15 NOTE — Patient Instructions (Addendum)
Two websites you can visit for OTC supplement information.  USP.[email or web address]  Use Cerave lotion on feet  If toenail fungus not improved after several months and you want to switch to oral medication, please call office.

## 2020-05-15 NOTE — Progress Notes (Signed)
   Subjective:    Patient ID: Tina [name], female    DOB: 22-Apr-1956, 64 y.o.   MRN: [id number]  HPI  The patient comes in today for a wellness visit.    A review of their health history was completed.  A review of medications was also completed.  Any needed refills; no  Eating habits: good  Falls/  MVA accidents in past few months: none  Regular exercise: 4-5 times a week  Specialist pt sees on regular basis: dermatologist  Preventative health issues were discussed.   Additional concerns: foot fungus continues, discuss supplements, discuss shingles vaccine      Review of Systems     Objective:   Physical Exam        Assessment & Plan:

## 2020-05-18 ENCOUNTER — Encounter: Payer: Self-pay | Admitting: Nurse Practitioner

## 2020-05-18 DIAGNOSIS — B351 Tinea unguium: Secondary | ICD-10-CM | POA: Insufficient documentation

## 2020-05-22 ENCOUNTER — Ambulatory Visit
Admission: RE | Admit: 2020-05-22 | Discharge: 2020-05-22 | Disposition: A | Discharge status: Home / Self Care | Payer: BC Managed Care – PPO | Source: Ambulatory Visit | Attending: Family Medicine | Admitting: Family Medicine

## 2020-05-22 DIAGNOSIS — Z1231 Encounter for screening mammogram for malignant neoplasm of breast: Secondary | ICD-10-CM | POA: Diagnosis present

## 2020-06-24 ENCOUNTER — Other Ambulatory Visit: Payer: Self-pay | Admitting: Radiology

## 2020-06-24 ENCOUNTER — Other Ambulatory Visit: Payer: BC Managed Care – PPO

## 2020-06-24 DIAGNOSIS — Z20822 Contact with and (suspected) exposure to covid-19: Secondary | ICD-10-CM

## 2020-06-25 LAB — SARS-COV-2, NAA 2 DAY TAT

## 2020-06-25 LAB — NOVEL CORONAVIRUS, NAA: SARS-CoV-2, NAA: NOT DETECTED

## 2020-07-01 ENCOUNTER — Other Ambulatory Visit: Payer: BC Managed Care – PPO

## 2020-07-01 ENCOUNTER — Other Ambulatory Visit: Payer: Self-pay | Admitting: Sleep Medicine

## 2020-07-01 DIAGNOSIS — I471 Supraventricular tachycardia, unspecified: Secondary | ICD-10-CM

## 2020-07-03 LAB — NOVEL CORONAVIRUS, NAA: SARS-CoV-2, NAA: NOT DETECTED

## 2021-04-19 ENCOUNTER — Telehealth: Payer: BC Managed Care – PPO | Admitting: Physician Assistant

## 2021-04-19 DIAGNOSIS — J069 Acute upper respiratory infection, unspecified: Secondary | ICD-10-CM

## 2021-04-19 MED ORDER — PREDNISONE 10 MG PO TABS: 30.0000 mg | ORAL_TABLET | Freq: Every day | ORAL | 0 refills | Status: AC

## 2021-04-19 MED ORDER — BENZONATATE 100 MG PO CAPS: 100.0000 mg | ORAL_CAPSULE | Freq: Three times a day (TID) | ORAL | 0 refills | Status: AC | PRN

## 2021-04-19 NOTE — Progress Notes (Signed)
We are sorry that you are not feeling well.  Here is how we plan to help!  Based on your presentation I believe you most likely have A cough due to a virus.  This is called viral bronchitis and is best treated by rest, plenty of fluids and control of the cough.  You may use Ibuprofen or Tylenol as directed to help your symptoms.     In addition you may use A prescription cough medication called Tessalon Perles 100mg . You may take 1-2 capsules every 8 hours as needed for your cough.  Giving tightness in throat from coughing (inflammation), I am giving you a few days of a low-dose steroid. If not improving, you need to be seen in person.   From your responses in the eVisit questionnaire you describe inflammation in the upper respiratory tract which is causing a significant cough.  This is commonly called Bronchitis and has four common causes:   Allergies Viral Infections Acid Reflux Bacterial Infection Allergies, viruses and acid reflux are treated by controlling symptoms or eliminating the cause. An example might be a cough caused by taking certain blood pressure medications. You stop the cough by changing the medication. Another example might be a cough caused by acid reflux. Controlling the reflux helps control the cough.  USE OF BRONCHODILATOR ("RESCUE") INHALERS: There is a risk from using your bronchodilator too frequently.  The risk is that over-reliance on a medication which only relaxes the muscles surrounding the breathing tubes can reduce the effectiveness of medications prescribed to reduce swelling and congestion of the tubes themselves.  Although you feel brief relief from the bronchodilator inhaler, your asthma may actually be worsening with the tubes becoming more swollen and filled with mucus.  This can delay other crucial treatments, such as oral steroid medications. If you need to use a bronchodilator inhaler daily, several times per day, you should discuss this with your provider.   There are probably better treatments that could be used to keep your asthma under control.     HOME CARE Only take medications as instructed by your medical team. Complete the entire course of an antibiotic. Drink plenty of fluids and get plenty of rest. Avoid close contacts especially the very [place] and the elderly Cover your mouth if you cough or cough into your sleeve. Always remember to wash your hands A steam or ultrasonic humidifier can help congestion.   GET HELP RIGHT AWAY IF: You develop worsening fever. You become short of breath You cough up blood. Your symptoms persist after you have completed your treatment plan MAKE SURE YOU  Understand these instructions. Will watch your condition. Will get help right away if you are not doing well or get worse.  Your e-visit answers were reviewed by a board certified advanced clinical practitioner to complete your personal care plan.  Depending on the condition, your plan could have included both over the counter or prescription medications. If there is a problem please reply  once you have received a response from your provider. Your safety is important to [place].  If you have drug allergies check your prescription carefully.    You can use MyChart to ask questions about today's visit, request a non-urgent call back, or ask for a work or school excuse for 24 hours related to this e-Visit. If it has been greater than 24 hours you will need to follow up with your provider, or enter a new e-Visit to address those concerns. You will get an e-mail  in the next two days asking about your experience.  I hope that your e-visit has been valuable and will speed your recovery. Thank you for using e-visits.  

## 2021-04-19 NOTE — Progress Notes (Signed)
I have spent 5 minutes in review of e-visit questionnaire, review and updating patient chart, medical decision making and response to patient.   Molly Maselli Cody Merrit Friesen, PA-C    

## 2021-06-03 DIAGNOSIS — Z1231 Encounter for screening mammogram for malignant neoplasm of breast: Secondary | ICD-10-CM

## 2021-06-15 ENCOUNTER — Other Ambulatory Visit: Payer: Self-pay

## 2021-06-15 ENCOUNTER — Ambulatory Visit
Admission: RE | Admit: 2021-06-15 | Discharge: 2021-06-15 | Disposition: A | Discharge status: Home / Self Care | Payer: BC Managed Care – PPO | Source: Ambulatory Visit | Attending: Family Medicine | Admitting: Family Medicine

## 2021-06-15 DIAGNOSIS — Z1231 Encounter for screening mammogram for malignant neoplasm of breast: Secondary | ICD-10-CM | POA: Insufficient documentation

## 2022-05-13 IMAGING — MG DIGITAL SCREENING BILAT W/ TOMO W/ CAD
8 series · 8 of 24 positions shown · non-contrast
Comparison: Previous exam(s).

CLINICAL DATA: Screening.

EXAM:
DIGITAL SCREENING BILATERAL MAMMOGRAM WITH TOMO AND CAD

[L MLO synth-2D]
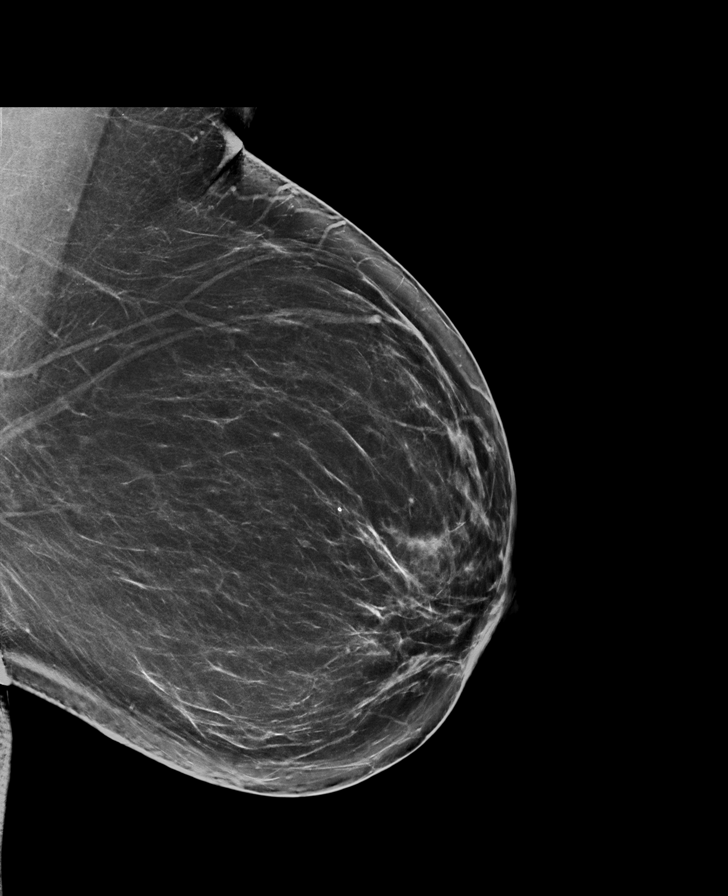

[R MLO synth-2D]
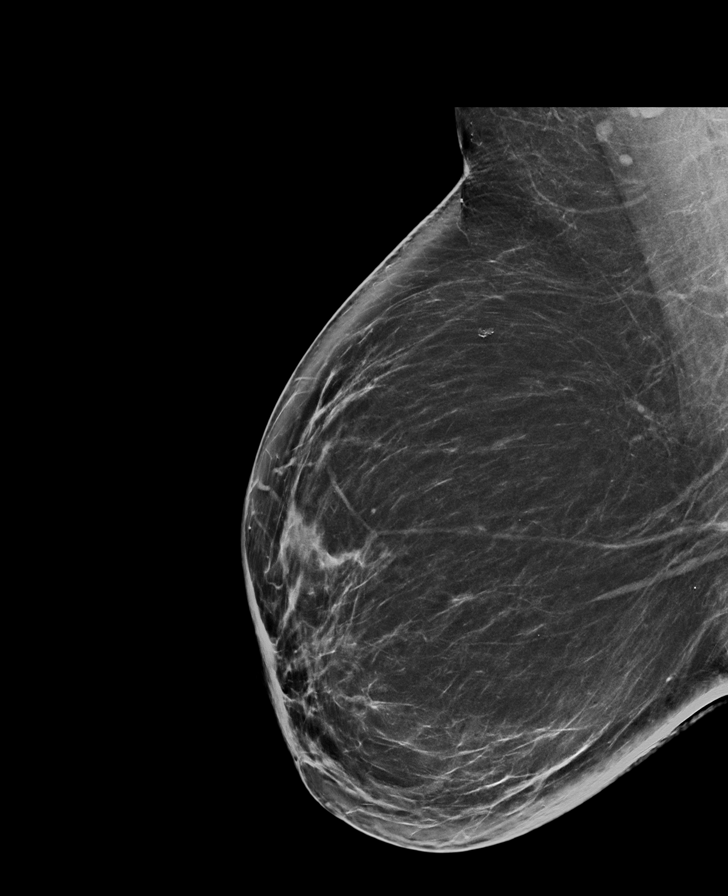

[R CC synth-2D]
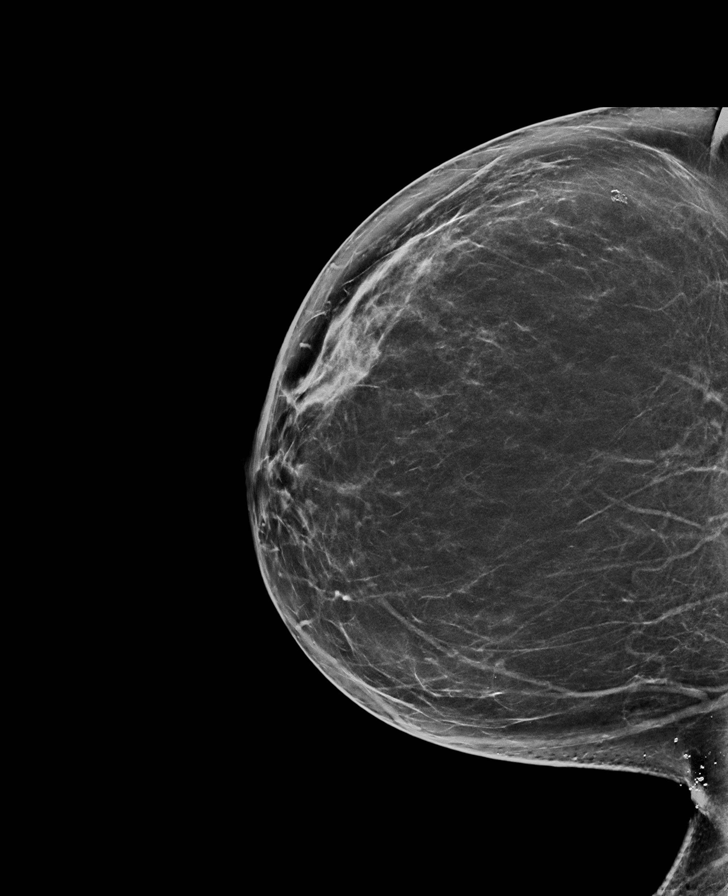

[L CC synth-2D]
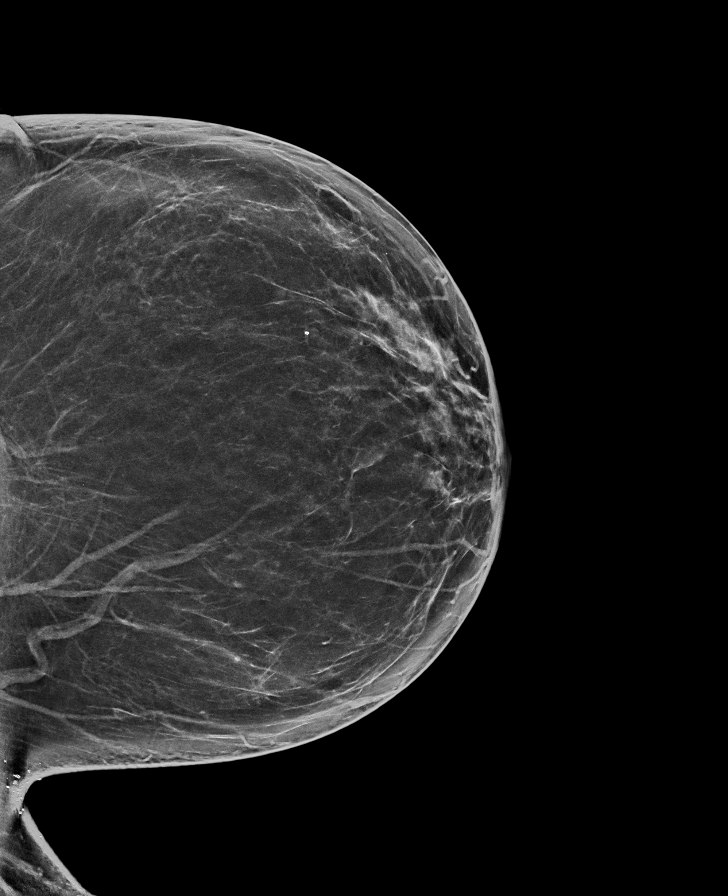

[R MLO tomo · tomo slice 44/87.0]
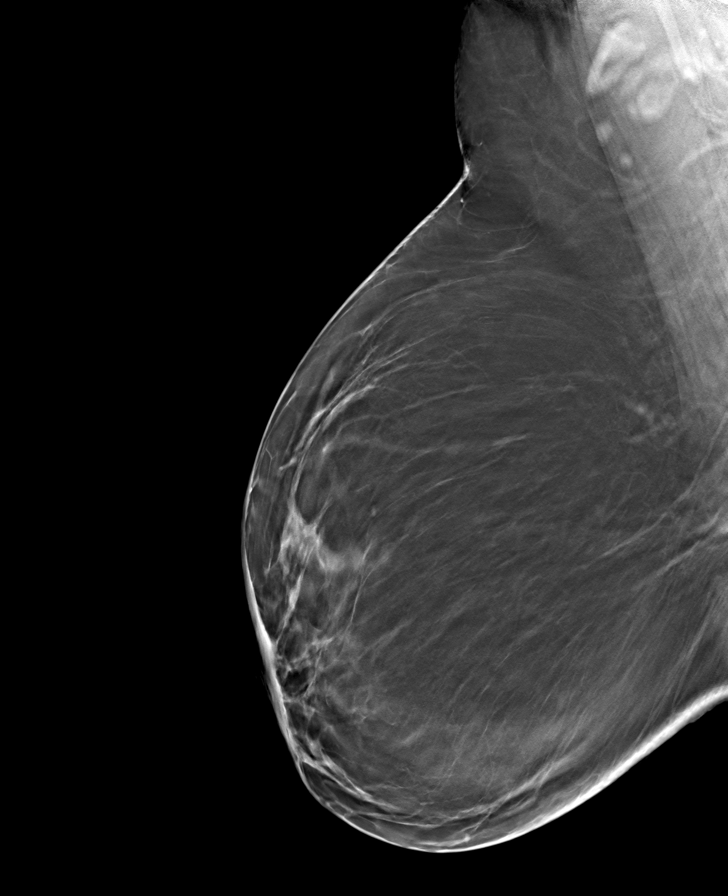

[L CC tomo · tomo slice 39/76.0]
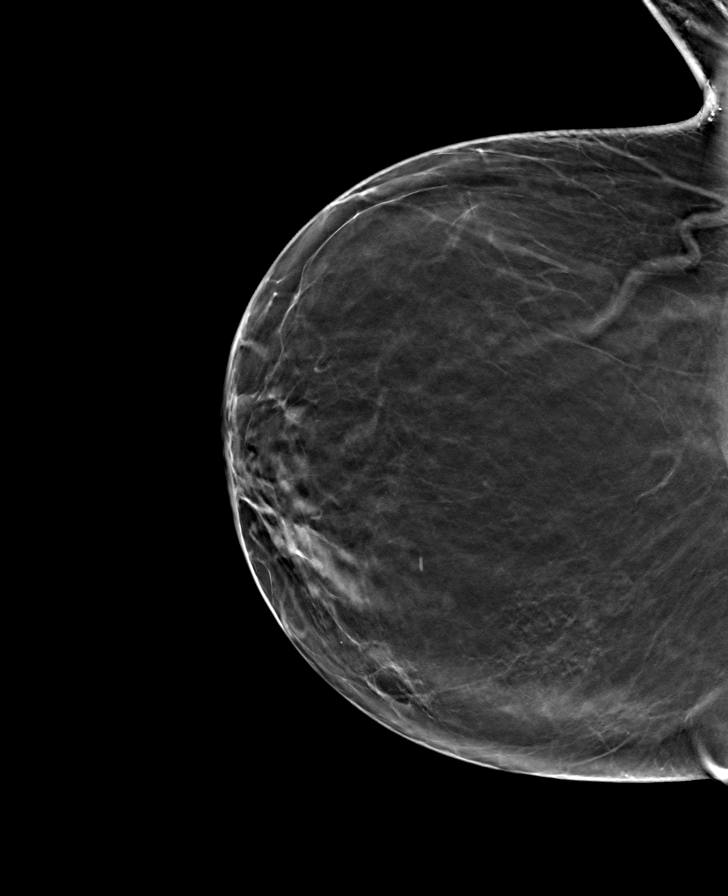

[L MLO tomo · tomo slice 43/85.0]
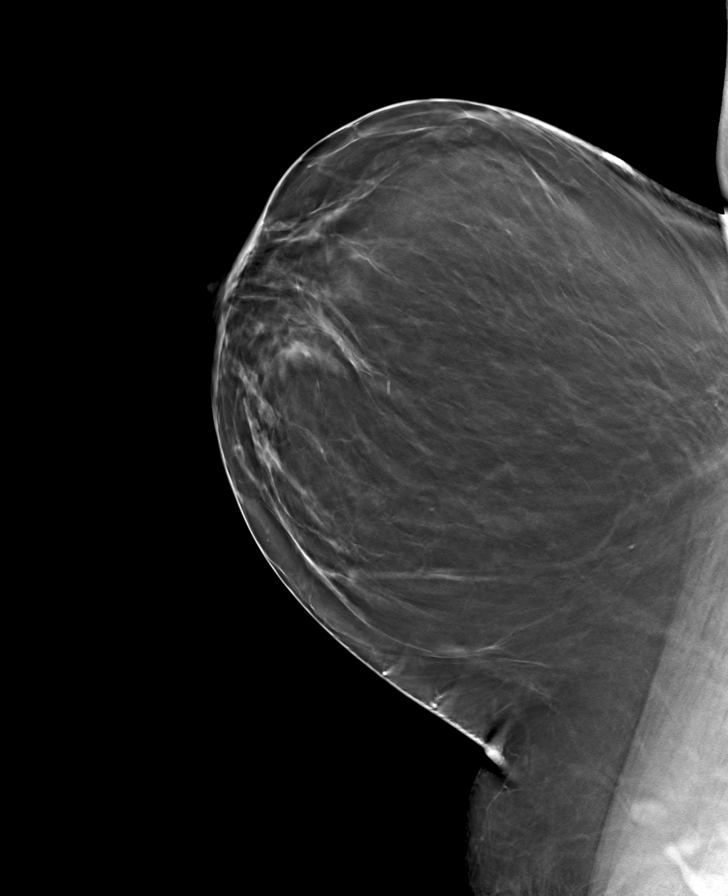

[R CC tomo · tomo slice 38/75.0]
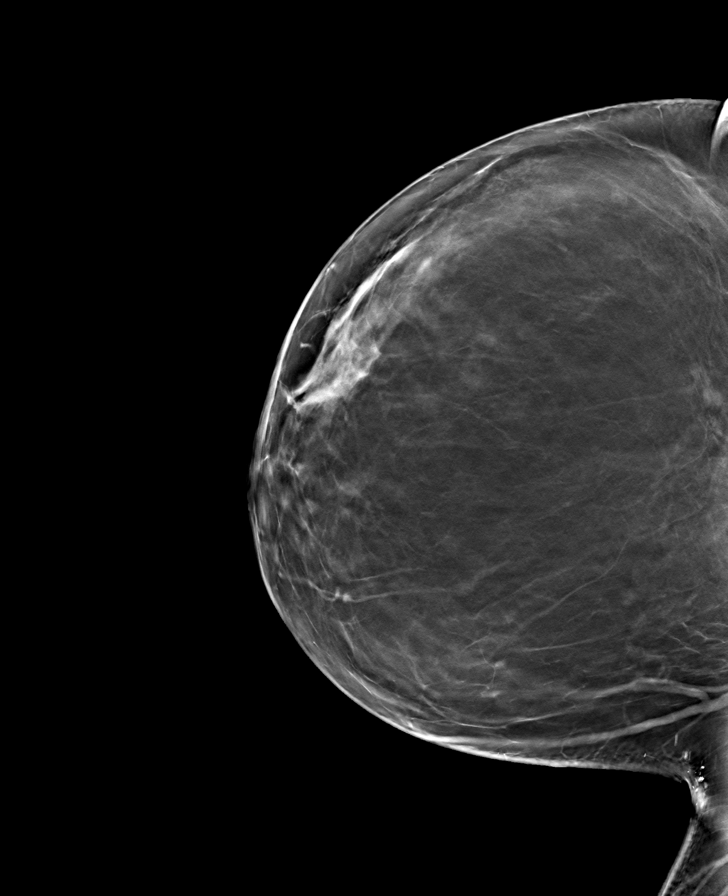

[8 of 24 positions shown; findings below may reference images not displayed]

ACR Breast Density Category b: There are scattered areas of
fibroglandular density.
FINDINGS: There are no findings suspicious for malignancy. Images were
processed with CAD.
IMPRESSION: No mammographic evidence of malignancy. A result letter of this
screening mammogram will be mailed directly to the patient.

RECOMMENDATION:
Screening mammogram in one year. (Code:[id number])

BI-RADS CATEGORY  1: Negative.

## 2022-05-31 ENCOUNTER — Other Ambulatory Visit: Payer: Self-pay | Admitting: Internal Medicine

## 2022-05-31 DIAGNOSIS — Z1231 Encounter for screening mammogram for malignant neoplasm of breast: Secondary | ICD-10-CM

## 2022-06-16 ENCOUNTER — Ambulatory Visit
Admission: RE | Admit: 2022-06-16 | Discharge: 2022-06-16 | Disposition: A | Discharge status: Home / Self Care | Payer: BC Managed Care – PPO | Source: Ambulatory Visit | Attending: Internal Medicine | Admitting: Internal Medicine

## 2022-06-16 DIAGNOSIS — Z1231 Encounter for screening mammogram for malignant neoplasm of breast: Secondary | ICD-10-CM | POA: Insufficient documentation

## 2022-12-05 ENCOUNTER — Encounter: Payer: Self-pay | Admitting: Surgery

## 2022-12-05 ENCOUNTER — Other Ambulatory Visit: Payer: Self-pay

## 2022-12-05 ENCOUNTER — Observation Stay
Admission: EM | Admit: 2022-12-05 | Discharge: 2022-12-06 | Disposition: A | Discharge status: Home / Self Care | Payer: BC Managed Care – PPO | Attending: Surgery | Admitting: Surgery

## 2022-12-05 ENCOUNTER — Encounter
Admission: EM | Disposition: A | Discharge status: Home / Self Care | Payer: Self-pay | Source: Home / Self Care | Attending: Surgery

## 2022-12-05 ENCOUNTER — Emergency Department: Payer: BC Managed Care – PPO

## 2022-12-05 ENCOUNTER — Inpatient Hospital Stay: Payer: BC Managed Care – PPO | Admitting: Anesthesiology

## 2022-12-05 DIAGNOSIS — R1031 Right lower quadrant pain: Secondary | ICD-10-CM

## 2022-12-05 DIAGNOSIS — Z79899 Other long term (current) drug therapy: Secondary | ICD-10-CM | POA: Diagnosis not present

## 2022-12-05 DIAGNOSIS — K3580 Unspecified acute appendicitis: Principal | ICD-10-CM

## 2022-12-05 HISTORY — PX: LAPAROSCOPIC APPENDECTOMY: SHX408

## 2022-12-05 LAB — CBC WITH DIFFERENTIAL/PLATELET
Abs Immature Granulocytes: 0.06 10*3/uL (ref 0.00–0.07)
Basophils Absolute: 0.1 10*3/uL (ref 0.0–0.1)
Basophils Relative: 1 %
Eosinophils Absolute: 0.3 10*3/uL (ref 0.0–0.5)
Eosinophils Relative: 2 %
HCT: 41.3 % (ref 36.0–46.0)
Hemoglobin: 13.7 g/dL (ref 12.0–15.0)
Immature Granulocytes: 1 %
Lymphocytes Relative: 33 %
Lymphs Abs: 4.3 10*3/uL — ABNORMAL HIGH (ref 0.7–4.0)
MCH: 28.3 pg (ref 26.0–34.0)
MCHC: 33.2 g/dL (ref 30.0–36.0)
MCV: 85.3 fL (ref 80.0–100.0)
Monocytes Absolute: 0.7 10*3/uL (ref 0.1–1.0)
Monocytes Relative: 5 %
Neutro Abs: 7.6 10*3/uL (ref 1.7–7.7)
Neutrophils Relative %: 58 %
Platelets: 271 10*3/uL (ref 150–400)
RBC: 4.84 MIL/uL (ref 3.87–5.11)
RDW: 13 % (ref 11.5–15.5)
WBC: 13 10*3/uL — ABNORMAL HIGH (ref 4.0–10.5)
nRBC: 0 % (ref 0.0–0.2)

## 2022-12-05 LAB — URINALYSIS, ROUTINE W REFLEX MICROSCOPIC
Bilirubin Urine: NEGATIVE
Glucose, UA: NEGATIVE mg/dL
Hgb urine dipstick: NEGATIVE
Ketones, ur: NEGATIVE mg/dL
Nitrite: NEGATIVE
Protein, ur: NEGATIVE mg/dL
Specific Gravity, Urine: 1.011 (ref 1.005–1.030)
Squamous Epithelial / HPF: NONE SEEN /HPF (ref 0–5)
pH: 6 (ref 5.0–8.0)

## 2022-12-05 LAB — COMPREHENSIVE METABOLIC PANEL
ALT: 18 U/L (ref 0–44)
AST: 22 U/L (ref 15–41)
Albumin: 4 g/dL (ref 3.5–5.0)
Alkaline Phosphatase: 59 U/L (ref 38–126)
Anion gap: 11 (ref 5–15)
BUN: 22 mg/dL (ref 8–23)
CO2: 25 mmol/L (ref 22–32)
Calcium: 9.6 mg/dL (ref 8.9–10.3)
Chloride: 104 mmol/L (ref 98–111)
Creatinine, Ser: 0.81 mg/dL (ref 0.44–1.00)
GFR, Estimated: >60 mL/min (ref >60)
Glucose, Bld: 147 mg/dL — ABNORMAL HIGH (ref 70–99)
Potassium: 3.6 mmol/L (ref 3.5–5.1)
Sodium: 140 mmol/L (ref 135–145)
Total Bilirubin: 1 mg/dL (ref 0.3–1.2)
Total Protein: 7.2 g/dL (ref 6.5–8.1)

## 2022-12-05 LAB — HIV ANTIBODY (ROUTINE TESTING W REFLEX): HIV Screen 4th Generation wRfx: NONREACTIVE

## 2022-12-05 LAB — LIPASE, BLOOD: Lipase: 46 U/L (ref 11–51)

## 2022-12-05 SURGERY — APPENDECTOMY, LAPAROSCOPIC
Anesthesia: General

## 2022-12-05 MED ORDER — ONDANSETRON HCL 4 MG/2ML IJ SOLN: 4.0000 mg | Freq: Once | INTRAMUSCULAR | Status: DC | PRN

## 2022-12-05 MED ORDER — IOHEXOL 300 MG/ML  SOLN
100.0000 mL | Freq: Once | INTRAMUSCULAR | Status: AC | PRN
  Administered 2022-12-05: 100 mL via INTRAVENOUS

## 2022-12-05 MED ORDER — ONDANSETRON HCL 4 MG/2ML IJ SOLN
4.0000 mg | Freq: Once | INTRAMUSCULAR | Status: AC
  Administered 2022-12-05: 4 mg via INTRAVENOUS
  Filled 2022-12-05: qty 2

## 2022-12-05 MED ORDER — SUCCINYLCHOLINE CHLORIDE 200 MG/10ML IV SOSY
PREFILLED_SYRINGE | INTRAVENOUS | Status: DC | PRN
  Administered 2022-12-05: 100 mg via INTRAVENOUS

## 2022-12-05 MED ORDER — MIDAZOLAM HCL 2 MG/2ML IJ SOLN
INTRAMUSCULAR | Status: DC | PRN
  Administered 2022-12-05: 2 mg via INTRAVENOUS

## 2022-12-05 MED ORDER — FENTANYL CITRATE (PF) 100 MCG/2ML IJ SOLN
INTRAMUSCULAR | Status: AC
  Filled 2022-12-05: qty 2

## 2022-12-05 MED ORDER — ROCURONIUM BROMIDE 100 MG/10ML IV SOLN
INTRAVENOUS | Status: DC | PRN
  Administered 2022-12-05: 40 mg via INTRAVENOUS

## 2022-12-05 MED ORDER — LACTATED RINGERS IV SOLN: INTRAVENOUS | Status: DC

## 2022-12-05 MED ORDER — ONDANSETRON 4 MG PO TBDP: 4.0000 mg | ORAL_TABLET | Freq: Four times a day (QID) | ORAL | Status: DC | PRN

## 2022-12-05 MED ORDER — SODIUM CHLORIDE 0.9 % IV BOLUS (SEPSIS)
1000.0000 mL | Freq: Once | INTRAVENOUS | Status: AC
  Administered 2022-12-05: 1000 mL via INTRAVENOUS

## 2022-12-05 MED ORDER — FENTANYL CITRATE (PF) 100 MCG/2ML IJ SOLN
INTRAMUSCULAR | Status: DC | PRN
  Administered 2022-12-05 (×2): 50 ug via INTRAVENOUS

## 2022-12-05 MED ORDER — OXYCODONE HCL 5 MG PO TABS: 5.0000 mg | ORAL_TABLET | ORAL | Status: DC | PRN

## 2022-12-05 MED ORDER — PIPERACILLIN-TAZOBACTAM 3.375 G IVPB 30 MIN
3.3750 g | Freq: Once | INTRAVENOUS | Status: AC
  Administered 2022-12-05: 3.375 g via INTRAVENOUS
  Filled 2022-12-05: qty 50

## 2022-12-05 MED ORDER — ACETAMINOPHEN 10 MG/ML IV SOLN
INTRAVENOUS | Status: DC | PRN
  Administered 2022-12-05: 1000 mg via INTRAVENOUS

## 2022-12-05 MED ORDER — KETOROLAC TROMETHAMINE 30 MG/ML IJ SOLN
30.0000 mg | Freq: Four times a day (QID) | INTRAMUSCULAR | Status: DC
  Administered 2022-12-05 – 2022-12-06 (×3): 30 mg via INTRAVENOUS
  Filled 2022-12-05 (×3): qty 1

## 2022-12-05 MED ORDER — POLYETHYLENE GLYCOL 3350 17 G PO PACK: 17.0000 g | PACK | Freq: Every day | ORAL | Status: DC | PRN

## 2022-12-05 MED ORDER — CHLORHEXIDINE GLUCONATE 0.12 % MT SOLN: 15.0000 mL | Freq: Once | OROMUCOSAL | Status: AC

## 2022-12-05 MED ORDER — SUGAMMADEX SODIUM 200 MG/2ML IV SOLN
INTRAVENOUS | Status: DC | PRN
  Administered 2022-12-05: 200 mg via INTRAVENOUS

## 2022-12-05 MED ORDER — MORPHINE SULFATE (PF) 4 MG/ML IV SOLN
4.0000 mg | Freq: Once | INTRAVENOUS | Status: AC
  Administered 2022-12-05: 4 mg via INTRAVENOUS
  Filled 2022-12-05: qty 1

## 2022-12-05 MED ORDER — BUPIVACAINE-EPINEPHRINE (PF) 0.5% -1:200000 IJ SOLN
INTRAMUSCULAR | Status: AC
  Filled 2022-12-05: qty 30

## 2022-12-05 MED ORDER — SODIUM CHLORIDE 0.9 % IV SOLN: INTRAVENOUS | Status: DC

## 2022-12-05 MED ORDER — SODIUM CHLORIDE 0.9 % IR SOLN
Status: DC | PRN
  Administered 2022-12-05: 1000 mL

## 2022-12-05 MED ORDER — PIPERACILLIN-TAZOBACTAM 3.375 G IVPB
INTRAVENOUS | Status: AC
  Filled 2022-12-05: qty 50

## 2022-12-05 MED ORDER — DEXAMETHASONE SODIUM PHOSPHATE 10 MG/ML IJ SOLN
INTRAMUSCULAR | Status: DC | PRN
  Administered 2022-12-05: 5 mg via INTRAVENOUS

## 2022-12-05 MED ORDER — MIDAZOLAM HCL 2 MG/2ML IJ SOLN
INTRAMUSCULAR | Status: AC
  Filled 2022-12-05: qty 2

## 2022-12-05 MED ORDER — CHLORHEXIDINE GLUCONATE 0.12 % MT SOLN
OROMUCOSAL | Status: AC
  Administered 2022-12-05: 15 mL via OROMUCOSAL
  Filled 2022-12-05: qty 15

## 2022-12-05 MED ORDER — ACETAMINOPHEN 10 MG/ML IV SOLN
INTRAVENOUS | Status: AC
  Filled 2022-12-05: qty 100

## 2022-12-05 MED ORDER — BUPIVACAINE-EPINEPHRINE (PF) 0.5% -1:200000 IJ SOLN
INTRAMUSCULAR | Status: DC | PRN
  Administered 2022-12-05: 30 mL

## 2022-12-05 MED ORDER — ONDANSETRON HCL 4 MG/2ML IJ SOLN
4.0000 mg | Freq: Four times a day (QID) | INTRAMUSCULAR | Status: DC | PRN
  Administered 2022-12-05: 4 mg via INTRAVENOUS

## 2022-12-05 MED ORDER — HYDROMORPHONE HCL 1 MG/ML IJ SOLN: 0.5000 mg | INTRAMUSCULAR | Status: DC | PRN

## 2022-12-05 MED ORDER — PIPERACILLIN-TAZOBACTAM 3.375 G IVPB
3.3750 g | Freq: Three times a day (TID) | INTRAVENOUS | Status: DC
  Administered 2022-12-05 – 2022-12-06 (×4): 3.375 g via INTRAVENOUS
  Filled 2022-12-05 (×2): qty 50

## 2022-12-05 MED ORDER — LIDOCAINE HCL (CARDIAC) PF 100 MG/5ML IV SOSY
PREFILLED_SYRINGE | INTRAVENOUS | Status: DC | PRN
  Administered 2022-12-05: 100 mg via INTRAVENOUS

## 2022-12-05 MED ORDER — PROPOFOL 10 MG/ML IV BOLUS
INTRAVENOUS | Status: DC | PRN
  Administered 2022-12-05: 150 mg via INTRAVENOUS

## 2022-12-05 MED ORDER — DEXMEDETOMIDINE HCL IN NACL 80 MCG/20ML IV SOLN
INTRAVENOUS | Status: DC | PRN
  Administered 2022-12-05: 8 ug via BUCCAL
  Administered 2022-12-05 (×2): 4 ug via BUCCAL

## 2022-12-05 MED ORDER — PANTOPRAZOLE SODIUM 40 MG IV SOLR
40.0000 mg | Freq: Every day | INTRAVENOUS | Status: DC
  Administered 2022-12-05: 40 mg via INTRAVENOUS
  Filled 2022-12-05: qty 10

## 2022-12-05 MED ORDER — ACETAMINOPHEN 500 MG PO TABS
1000.0000 mg | ORAL_TABLET | Freq: Four times a day (QID) | ORAL | Status: DC
  Administered 2022-12-05 – 2022-12-06 (×3): 1000 mg via ORAL
  Filled 2022-12-05 (×3): qty 2

## 2022-12-05 MED ORDER — FENTANYL CITRATE (PF) 100 MCG/2ML IJ SOLN: 25.0000 ug | INTRAMUSCULAR | Status: DC | PRN

## 2022-12-05 SURGICAL SUPPLY — 44 items
ADH SKN CLS APL DERMABOND .7 (GAUZE/BANDAGES/DRESSINGS) ×1
CUTTER FLEX LINEAR 45M (STAPLE) IMPLANT
DERMABOND ADVANCED .7 DNX12 (GAUZE/BANDAGES/DRESSINGS) ×2 IMPLANT
ELECT CAUTERY BLADE TIP 2.5 (TIP) ×1
ELECT REM PT RETURN 9FT ADLT (ELECTROSURGICAL) ×1
ELECTRODE CAUTERY BLDE TIP 2.5 (TIP) ×2 IMPLANT
ELECTRODE REM PT RTRN 9FT ADLT (ELECTROSURGICAL) ×2 IMPLANT
GLOVE SURG SYN 7.0 (GLOVE) ×3 IMPLANT
GLOVE SURG SYN 7.0 PF PI (GLOVE) ×2 IMPLANT
GLOVE SURG SYN 7.5  E (GLOVE) ×3
GLOVE SURG SYN 7.5 E (GLOVE) ×3 IMPLANT
GLOVE SURG SYN 7.5 PF PI (GLOVE) ×2 IMPLANT
GOWN STRL REUS W/ TWL LRG LVL3 (GOWN DISPOSABLE) ×4 IMPLANT
GOWN STRL REUS W/TWL LRG LVL3 (GOWN DISPOSABLE) ×3
IRRIGATION STRYKERFLOW (MISCELLANEOUS) IMPLANT
IRRIGATOR STRYKERFLOW (MISCELLANEOUS) ×1
IV NS 1000ML (IV SOLUTION) ×1
IV NS 1000ML BAXH (IV SOLUTION) ×2 IMPLANT
KIT TURNOVER KIT A (KITS) ×2 IMPLANT
LABEL OR SOLS (LABEL) ×2 IMPLANT
LIGASURE LAP MARYLAND 5MM 37CM (ELECTROSURGICAL) IMPLANT
MANIFOLD NEPTUNE II (INSTRUMENTS) ×2 IMPLANT
NEEDLE HYPO 22GX1.5 SAFETY (NEEDLE) ×2 IMPLANT
NS IRRIG 500ML POUR BTL (IV SOLUTION) ×2 IMPLANT
PACK LAP CHOLECYSTECTOMY (MISCELLANEOUS) ×2 IMPLANT
RELOAD 45 VASCULAR/THIN (ENDOMECHANICALS) IMPLANT
RELOAD STAPLE 45 2.5 WHT GRN (ENDOMECHANICALS) IMPLANT
RELOAD STAPLE 45 3.5 BLU ETS (ENDOMECHANICALS) ×2 IMPLANT
RELOAD STAPLE TA45 3.5 REG BLU (ENDOMECHANICALS) ×1 IMPLANT
SCISSORS METZENBAUM CVD 33 (INSTRUMENTS) ×2 IMPLANT
SLEEVE ADV FIXATION 5X100MM (TROCAR) ×2 IMPLANT
SUT MNCRL 4-0 (SUTURE) ×1
SUT MNCRL 4-0 27XMFL (SUTURE) ×1
SUT VICRYL 0 UR6 27IN ABS (SUTURE) ×2 IMPLANT
SUTURE MNCRL 4-0 27XMF (SUTURE) ×2 IMPLANT
SYS BAG RETRIEVAL 10MM (BASKET) ×1
SYS KII FIOS ACCESS ABD 5X100 (TROCAR) ×1
SYSTEM BAG RETRIEVAL 10MM (BASKET) ×2 IMPLANT
SYSTEM KII FIOS ACES ABD 5X100 (TROCAR) ×2 IMPLANT
TRAP FLUID SMOKE EVACUATOR (MISCELLANEOUS) ×2 IMPLANT
TRAY FOLEY MTR SLVR 16FR STAT (SET/KITS/TRAYS/PACK) ×2 IMPLANT
TROCAR BALLN GELPORT 12X130M (ENDOMECHANICALS) ×2 IMPLANT
TUBING EVAC SMOKE HEATED PNEUM (TUBING) ×2 IMPLANT
WATER STERILE IRR 500ML POUR (IV SOLUTION) ×2 IMPLANT

## 2022-12-05 NOTE — ED Triage Notes (Signed)
Patient reports right lower abdominal pain since 2200 that began as a dull pressure that she believed was gas pain, but state pain has worsened through the night. Vomited twice. Denies diarrhea. AOX4. Resp even, unlabored on RA. Denies chest pain or shortness of breath. Reports chills.

## 2022-12-05 NOTE — TOC Initial Note (Signed)
Transition of Care [facility]) - Initial/Assessment Note    Patient Details  Name: Tina [name] MRN: [id number] Date of Birth: 10/03/1956  Transition of Care [facility]) CM/SW Contact:    Beverly Sessions, RN Phone Number: 12/05/2022, 11:32 AM  Clinical Narrative:                  Transition of Care (TOC) Screening Note   Patient Details  Name: Tina [name] Date of Birth: Jun 01, 1956   Transition of Care [facility]) CM/SW Contact:    Beverly Sessions, RN Phone Number: 12/05/2022, 11:32 AM    Transition of Care Department [facility]) has reviewed patient and no TOC needs have been identified at this time. We will continue to monitor patient advancement through interdisciplinary progression rounds. If new patient transition needs arise, please place a TOC consult.          Patient Goals and CMS Choice            Expected Discharge Plan and Services                                              Prior Living Arrangements/Services                       Activities of Daily Living Home Assistive Devices/Equipment: None ADL Screening (condition at time of admission) Patient's cognitive ability adequate to safely complete daily activities?: Yes Is the patient deaf or have difficulty hearing?: No Does the patient have difficulty seeing, even when wearing glasses/contacts?: No Does the patient have difficulty concentrating, remembering, or making decisions?: No Patient able to express need for assistance with ADLs?: Yes Does the patient have difficulty dressing or bathing?: No Independently performs ADLs?: Yes (appropriate for developmental age) Does the patient have difficulty walking or climbing stairs?: No Weakness of Legs: None Weakness of Arms/Hands: None  Permission Sought/Granted                  Emotional Assessment              Admission diagnosis:  RLQ abdominal pain [R10.31] Acute appendicitis [K35.80] Acute appendicitis, unspecified acute  appendicitis type [K35.80] Patient Active Problem List   Diagnosis Date Noted   Acute appendicitis 12/05/2022   Onychomycosis 05/18/2020   Tinea pedis of both feet 08/06/2015   Chronic idiopathic constipation 07/31/2014   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: [phone]: [phone]    Social Determinants of Health (SDOH) Social History: SDOH Screenings   Food Insecurity: No Food Insecurity (12/05/2022)  Housing: Low Risk  (12/05/2022)  Transportation Needs: No Transportation Needs (12/05/2022)  Utilities: Not At Risk (12/05/2022)  Depression (PHQ2-9): Low Risk  (05/15/2020)  Tobacco Use: Low Risk  (12/05/2022)   SDOH Interventions:     Readmission Risk Interventions     No data to display

## 2022-12-05 NOTE — Anesthesia Procedure Notes (Signed)
Procedure Name: Intubation Date/Time: 12/05/2022 12:58 PM  Performed by: Biagio Borg, CRNAPre-anesthesia Checklist: Patient identified, Emergency Drugs available, Suction available and Patient being monitored Patient Re-evaluated:Patient Re-evaluated prior to induction Oxygen Delivery Method: Circle system utilized Preoxygenation: Pre-oxygenation with 100% oxygen Induction Type: IV induction and Rapid sequence Laryngoscope Size: McGraph and 3 Grade View: Grade II Tube type: Oral Tube size: 7.5 mm Number of attempts: 1 Airway Equipment and Method: Stylet Placement Confirmation: ETT inserted through vocal cords under direct vision, positive ETCO2 and breath sounds checked- equal and bilateral Secured at: 21 cm Tube secured with: Tape Dental Injury: Teeth and Oropharynx as per pre-operative assessment

## 2022-12-05 NOTE — Plan of Care (Signed)
  Problem: Nutrition: Goal: Adequate nutrition will be maintained Outcome: Progressing   Problem: Pain Managment: Goal: General experience of comfort will improve Outcome: Progressing   

## 2022-12-05 NOTE — Anesthesia Preprocedure Evaluation (Signed)
Anesthesia Evaluation  Patient identified by MRN, date of birth, ID band Patient awake    Reviewed: Allergy & Precautions, NPO status , Patient's Chart, lab work & pertinent test results  Airway Mallampati: III  TM Distance: >3 FB     Dental  (+) Teeth Intact   Pulmonary neg pulmonary ROS   breath sounds clear to auscultation       Cardiovascular Exercise Tolerance: Good  Rhythm:Regular     Neuro/Psych negative neurological ROS  negative psych ROS   GI/Hepatic negative GI ROS, Neg liver ROS,,,  Endo/Other  negative endocrine ROS    Renal/GU negative Renal ROS  negative genitourinary   Musculoskeletal negative musculoskeletal ROS (+)    Abdominal Normal abdominal exam  (+)   Peds negative pediatric ROS (+)  Hematology negative hematology ROS (+)   Anesthesia Other Findings   Reproductive/Obstetrics negative OB ROS                             Anesthesia Physical Anesthesia Plan  ASA: 2  Anesthesia Plan: General   Post-op Pain Management:    Induction: Intravenous  PONV Risk Score and Plan: 1 and Ondansetron and Dexamethasone  Airway Management Planned: Oral ETT  Additional Equipment:   Intra-op Plan:   Post-operative Plan: Extubation in OR  Informed Consent: I have reviewed the patients History and Physical, chart, labs and discussed the procedure including the risks, benefits and alternatives for the proposed anesthesia with the patient or authorized representative who has indicated his/her understanding and acceptance.       Plan Discussed with: CRNA and Surgeon  Anesthesia Plan Comments:        Anesthesia Quick Evaluation

## 2022-12-05 NOTE — ED Provider Notes (Signed)
[facility] Provider Note    Event Date/Time   First MD Initiated Contact with Patient 12/05/22 0250     (approximate)   History   Abdominal Pain   HPI  Tina [name] is a 67 y.o. female with history of abdominal hysterectomy with BSO, bladder suspension who presents emergency department with right lower quadrant abdominal pain, chills, nausea and vomiting started tonight.  No diarrhea.  No urinary symptoms.   History provided by patient, husband.    Past Medical History:  Diagnosis Date   Fibroid 2007   leiomyomata    Past Surgical History:  Procedure Laterality Date   ABDOMINAL HYSTERECTOMY  2007   w/ BSO   BLADDER SUSPENSION  12/05   COLONOSCOPY  2002   EYE SURGERY     LAPAROSCOPIC BILATERAL SALPINGO OOPHERECTOMY Bilateral 2007   TONSILLECTOMY  1980   TONSILLECTOMY AND ADENOIDECTOMY      MEDICATIONS:  Prior to Admission medications   Medication Sig Start Date End Date Taking? Authorizing Provider  benzonatate (TESSALON) 100 MG capsule Take 1 capsule (100 mg total) by mouth 3 (three) times daily as needed for cough. 04/19/21   Brunetta Jeans, PA-C  Calcium Carbonate-Vitamin D (CALCIUM + D PO) Take by mouth.    [provider]  Magnesium 500 MG TABS Take 500 mg by mouth.    [provider]  mometasone (ELOCON) 0.1 % cream Apply once daily to affected area 02/03/20   Mikey Kirschner, MD  triamcinolone cream (KENALOG) 0.1 % Apply BID to affected area 02/03/20   Mikey Kirschner, MD    Physical Exam   Triage Vital Signs: ED Triage Vitals  Enc Vitals Group     BP 12/05/22 0226 (!) 158/88     Pulse Rate 12/05/22 0226 74     Resp 12/05/22 0226 20     Temp 12/05/22 0226 97.9 F (36.6 C)     Temp Source 12/05/22 0226 Oral     SpO2 12/05/22 0226 93 %     Weight 12/05/22 0227 188 lb (85.3 kg)     Height 12/05/22 0227 '5\' 2"'$  (1.575 m)     Head Circumference --      Peak Flow --      Pain Score 12/05/22 0227 5      Pain Loc --      Pain Edu? --      Excl. in [place]? --     Most recent vital signs: Vitals:   12/05/22 0226  BP: (!) 158/88  Pulse: 74  Resp: 20  Temp: 97.9 F (36.6 C)  SpO2: 93%    CONSTITUTIONAL: Alert, responds appropriately to questions.  Peers uncomfortable HEAD: Normocephalic, atraumatic EYES: Conjunctivae clear, pupils appear equal, sclera nonicteric ENT: normal nose; moist mucous membranes NECK: Supple, normal ROM CARD: RRR; S1 and S2 appreciated RESP: Normal chest excursion without splinting or tachypnea; breath sounds clear and equal bilaterally; no wheezes, no rhonchi, no rales, no hypoxia or respiratory distress, speaking full sentences ABD/GI: Non-distended; soft, ender to palpation the right lower quadrant without guarding or rebound BACK: The back appears normal EXT: Normal ROM in all joints; no deformity noted, no edema SKIN: Normal color for age and race; warm; no rash on exposed skin NEURO: Moves all extremities equally, normal speech PSYCH: The patient's mood and manner are appropriate.   ED Results / Procedures / Treatments   LABS: (all labs ordered are listed, but only abnormal results are displayed)  Labs Reviewed  CBC WITH DIFFERENTIAL/PLATELET - Abnormal; Notable for the following components:      Result Value   WBC 13.0 (*)    Lymphs Abs 4.3 (*)    All other components within normal limits  COMPREHENSIVE METABOLIC PANEL - Abnormal; Notable for the following components:   Glucose, Bld 147 (*)    All other components within normal limits  URINALYSIS, ROUTINE W REFLEX MICROSCOPIC - Abnormal; Notable for the following components:   Color, Urine STRAW (*)    APPearance CLEAR (*)    Leukocytes,Ua SMALL (*)    Bacteria, UA RARE (*)    All other components within normal limits  LIPASE, BLOOD     EKG:  EKG Interpretation  Date/Time:  Monday December 05 2022 03:00:06 EST Ventricular Rate:  65 PR Interval:  168 QRS Duration: 109 QT  Interval:  437 QTC Calculation: 455 R Axis:   55 Text Interpretation: Sinus rhythm Confirmed by Pryor Curia [phone]) on 12/05/2022 3:01:56 AM         RADIOLOGY: My personal review and interpretation of imaging: The scan shows acute appendicitis.  I have personally reviewed all radiology reports.   CT ABDOMEN PELVIS W CONTRAST  Result Date: 12/05/2022 CLINICAL DATA:  Right lower quadrant abdominal pain EXAM: CT ABDOMEN AND PELVIS WITH CONTRAST TECHNIQUE: Multidetector CT imaging of the abdomen and pelvis was performed using the standard protocol following bolus administration of intravenous contrast. RADIATION DOSE REDUCTION: This exam was performed according to the departmental dose-optimization program which includes automated exposure control, adjustment of the mA and/or kV according to patient size and/or use of iterative reconstruction technique. CONTRAST:  153m OMNIPAQUE IOHEXOL 300 MG/ML  SOLN COMPARISON:  None Available. FINDINGS: Lower Chest: Normal. Hepatobiliary: Normal hepatic contours. No intra- or extrahepatic biliary dilatation. The gallbladder is normal. Pancreas: Normal pancreas. No ductal dilatation or peripancreatic fluid collection. Spleen: Normal. Adrenals/Urinary Tract: The adrenal glands are normal. No hydronephrosis, nephroureterolithiasis or solid renal mass. The urinary bladder is normal for degree of distention Stomach/Bowel: There is no hiatal hernia. Normal duodenal course and caliber. No small bowel dilatation or inflammation. No focal colonic abnormality. Appendix location: Retrocecal (11 o'clock) Diameter: 8 mm Appendicolith: None Mucosal hyper-enhancement: Yes Extraluminal gas: None Periappendiceal collection: None . Vascular/Lymphatic: There is calcific atherosclerosis of the abdominal aorta. No lymphadenopathy. Reproductive: Status post hysterectomy. No adnexal mass. Other: None. Musculoskeletal: No bony spinal canal stenosis or focal osseous abnormality. IMPRESSION:  1. Mildly dilated appendix with mucosal hyper-enhancement and periappendiceal inflammation, consistent with acute appendicitis. No free air or abscess formation. Aortic Atherosclerosis (ICD10-I70.0). Electronically Signed   By: KUlyses JarredM.D.   On: 12/05/2022 03:35     PROCEDURES:  Critical Care performed: No      .1-3 Lead EKG Interpretation  Performed by: Blossom Crume, KDelice Bison DO Authorized by: Jed Kutch, KDelice Bison DO     Interpretation: normal     ECG rate:  74   ECG rate assessment: normal     Rhythm: sinus rhythm     Ectopy: none     Conduction: normal       IMPRESSION / MDM / ASSESSMENT AND PLAN / ED COURSE  I reviewed the triage vital signs and the nursing notes.    Patient here with right lower quadrant abdominal pain, nausea and vomiting.  The patient is on the cardiac monitor to evaluate for evidence of arrhythmia and/or significant heart rate changes.   DIFFERENTIAL DIAGNOSIS (includes but not limited to):   Appendicitis,  kidney stone, UTI, pyelonephritis, colitis, bowel obstruction, perforation   Patient's presentation is most consistent with acute presentation with potential threat to life or bodily function.   PLAN: Will obtain CBC, CMP, lipase, urinalysis, CT of the abdomen pelvis.  Will give IV fluids, pain and nausea medicine.   MEDICATIONS GIVEN IN ED: Medications  sodium chloride 0.9 % bolus 1,000 mL (1,000 mLs Intravenous New Bag/Given 12/05/22 0334)  piperacillin-tazobactam (ZOSYN) IVPB 3.375 g (has no administration in time range)  0.9 %  sodium chloride infusion (has no administration in time range)  morphine (PF) 4 MG/ML injection 4 mg (4 mg Intravenous Given 12/05/22 0336)  ondansetron (ZOFRAN) injection 4 mg (4 mg Intravenous Given 12/05/22 0335)  iohexol (OMNIPAQUE) 300 MG/ML solution 100 mL (100 mLs Intravenous Contrast Given 12/05/22 0316)     ED COURSE: Labs show leukocytosis of 13,000.  Normal electrolytes, renal function, LFTs and lipase.   Urine shows some pyuria but no other significant sign of infection.  CT scan reviewed and interpreted by myself and the radiologist and shows acute nonperforated appendicitis.  Will give Zosyn.  Patient is otherwise healthy and does not take any prescription medications.  Will discuss with general surgery for admission.   CONSULTS:  Consulted and discussed patient's case with general surgeon, Dr. Hampton Abbot.  I have recommended admission and consulting physician agrees and will place admission orders.  Patient (and family if present) agree with this plan.   I reviewed all nursing notes, vitals, pertinent previous records.  All labs, EKGs, imaging ordered have been independently reviewed and interpreted by myself.    OUTSIDE RECORDS REVIEWED: Reviewed last PCP note on 02/14/2022.       FINAL CLINICAL IMPRESSION(S) / ED DIAGNOSES   Final diagnoses:  RLQ abdominal pain  Acute appendicitis, unspecified acute appendicitis type     Rx / DC Orders   ED Discharge Orders     None        Note:  This document was prepared using Dragon voice recognition software and may include unintentional dictation errors.   Salia Cangemi, Delice Bison, DO 12/05/22 [phone]

## 2022-12-05 NOTE — Transfer of Care (Signed)
Immediate Anesthesia Transfer of Care Note  Patient: Tina [name]  Procedure(s) Performed: APPENDECTOMY LAPAROSCOPIC  Patient Location: PACU  Anesthesia Type:General  Level of Consciousness: awake and drowsy  Airway & Oxygen Therapy: Patient Spontanous Breathing  Post-op Assessment: Report given to RN and Post -op Vital signs reviewed and stable  Post vital signs: Reviewed and stable  Last Vitals:  Vitals Value Taken Time  BP 103/58 12/05/22 1410  Temp 36.9 C 12/05/22 1410  Pulse 75 12/05/22 1412  Resp 23 12/05/22 1412  SpO2 95 % 12/05/22 1412  Vitals shown include unvalidated device data.  Last Pain:  Vitals:   12/05/22 1150  TempSrc:   PainSc: 5          Complications: No notable events documented.

## 2022-12-05 NOTE — Op Note (Signed)
  Procedure Date:  12/05/2022  Pre-operative Diagnosis:  Acute appendicitis  Post-operative Diagnosis: Acute appendicitis  Procedure:  Laparoscopic appendectomy  Surgeon:  Melvyn Neth, MD  Anesthesia:  General endotracheal  Estimated Blood Loss:  10 ml  Specimens:  appendix  Complications:  None  Indications for Procedure:  This is a 67 y.o. female who presents with abdominal pain and workup revealing acute appendicitis.  The options of surgery versus observation were reviewed with the patient and/or family. The risks of bleeding, infection, recurrence of symptoms, negative laparoscopy, potential for an open procedure, bowel injury, abscess or infection, were all discussed with the patient and she was willing to proceed.  Description of Procedure: The patient was correctly identified in the preoperative area and brought into the operating room.  The patient was placed supine with VTE prophylaxis in place.  Appropriate time-outs were performed.  Anesthesia was induced and the patient was intubated.  Foley catheter was placed.  Appropriate antibiotics were infused.  The abdomen was prepped and draped in a sterile fashion. An infraumbilical incision was made. A cutdown technique was used to enter the abdominal cavity without injury, and a Hasson trocar was inserted.  Pneumoperitoneum was obtained with appropriate opening pressures.  Two 5-mm ports were placed in the suprapubic and left lateral positions under direct visualization.  The right lower quadrant was inspected and the appendix was identified and found to be acutely inflamed with mild thick serous fluid but no evidence of perforation or abscess.  The appendix was carefully dissected.  The mesoappendix was divided using the LigaSure.  The base of the appendix was dissected out and divided with a standard load Endo GIA.  The appendix was placed in an Endocatch bag.  The right lower quadrant was then inspected again revealing an  intact staple line, no bleeding, and no bowel injury.  The area was thoroughly irrigated.  The 5 mm ports were removed under direct visualization and the Hasson trocar was removed.  The Endocatch bag was brought out through the umbilical incision.  The fascial opening was closed using 0 vicryl suture.  Local anesthetic was infused in all incisions and the incisions were closed with 4-0 Monocryl.  The wounds were cleaned and sealed with DermaBond.  Foley catheter was removed and the patient was emerged from anesthesia and extubated and brought to the recovery room for further management.  The patient tolerated the procedure well and all counts were correct at the end of the case.   Melvyn Neth, MD

## 2022-12-05 NOTE — H&P (Signed)
[facility] SURGICAL HISTORY & PHYSICAL (cpt [phone])  HISTORY OF PRESENT ILLNESS (HPI):  67 y.o. female presented to [facility] ED overnight for abdominal pain. Patient reports the acute onset of abdominal pain around 10 PM last night. This was located in her RLQ and sharp in nature. She reported associated chills, nausea, and emesis as well. No fever, cough, CP, SOB, urinary changes, or bowel changes. No history of similar in the past. Previous intra-abdominal surgeries positive for abdominal hysterectomy and BSO. Work up in the ED revealed acute uncomplicated appendicitis.   General surgery is consulted by emergency medicine physician Dr Pryor Curia, DO for evaluation and management of acute appendicitis.    PAST MEDICAL HISTORY (PMH):  Past Medical History:  Diagnosis Date   Fibroid 2007   leiomyomata    Reviewed. Otherwise negative.   PAST SURGICAL HISTORY ([place]):  Past Surgical History:  Procedure Laterality Date   ABDOMINAL HYSTERECTOMY  2007   w/ BSO   BLADDER SUSPENSION  12/05   COLONOSCOPY  2002   EYE SURGERY     LAPAROSCOPIC BILATERAL SALPINGO OOPHERECTOMY Bilateral 2007   TONSILLECTOMY  1980   TONSILLECTOMY AND ADENOIDECTOMY      Reviewed. Otherwise negative.   MEDICATIONS:  Prior to Admission medications   Medication Sig Start Date End Date Taking? Authorizing Provider  benzonatate (TESSALON) 100 MG capsule Take 1 capsule (100 mg total) by mouth 3 (three) times daily as needed for cough. Patient not taking: Reported on 12/05/2022 04/19/21   Brunetta Jeans, PA-C  Calcium Carbonate-Vitamin D (CALCIUM + D PO) Take by mouth.    [provider]  Magnesium 500 MG TABS Take 500 mg by mouth.    [provider]  mometasone (ELOCON) 0.1 % cream Apply once daily to affected area Patient not taking: Reported on 12/05/2022 02/03/20   Mikey Kirschner, MD  triamcinolone cream (KENALOG) 0.1 % Apply BID to affected area Patient not taking: Reported on  12/05/2022 02/03/20   Mikey Kirschner, MD     ALLERGIES:  No Known Allergies   SOCIAL HISTORY:  Social History   Socioeconomic History   Marital status: Married    Spouse name: Not on file   Number of children: Not on file   Years of education: Not on file   Highest education level: Not on file  Occupational History   Not on file  Tobacco Use   Smoking status: Never   Smokeless tobacco: Never  Substance and Sexual Activity   Alcohol use: No   Drug use: No   Sexual activity: Yes    Birth control/protection: Surgical    Comment: vasectomy  Other Topics Concern   Not on file  Social History Narrative   Not on file   Social Determinants of Health   Financial Resource Strain: Not on file  Food Insecurity: No Food Insecurity (12/05/2022)   Hunger Vital Sign    Worried About Running Out of Food in the Last Year: Never true    Ran Out of Food in the Last Year: Never true  Transportation Needs: No Transportation Needs (12/05/2022)   PRAPARE - Hydrologist (Medical): No    Lack of Transportation (Non-Medical): No  Physical Activity: Not on file  Stress: Not on file  Social Connections: Not on file  Intimate Partner Violence: Not At Risk (12/05/2022)   Humiliation, Afraid, Rape, and Kick questionnaire    Fear of Current or Ex-Partner: No  Emotionally Abused: No    Physically Abused: No    Sexually Abused: No     FAMILY HISTORY:  Family History  Problem Relation Age of Onset   Hypertension Father    Heart disease Father 33       died of MI   Cancer Mother        stomach   Breast cancer Maternal Aunt    Breast cancer Maternal Aunt     Otherwise negative.   REVIEW OF SYSTEMS:  Review of Systems  Constitutional:  Positive for chills. Negative for fever.  HENT:  Negative for congestion and sore throat.   Respiratory:  Negative for cough and hemoptysis.   Cardiovascular:  Negative for chest pain and palpitations.  Gastrointestinal:   Positive for abdominal pain, nausea and vomiting. Negative for constipation and diarrhea.  Genitourinary:  Negative for dysuria and urgency.  All other systems reviewed and are negative.   VITAL SIGNS:  Temp:  [97.9 F (36.6 C)] 97.9 F (36.6 C) (02/05 0452) Pulse Rate:  [64-74] 64 (02/05 0452) Resp:  [18-20] 18 (02/05 0452) BP: (102-158)/(66-88) 113/68 (02/05 0452) SpO2:  [90 %-100 %] 100 % (02/05 0452) Weight:  [82.4 kg-85.3 kg] 82.4 kg (02/05 0500)     Height: '5\' 2"'$  (157.5 cm) Weight: 82.4 kg BMI (Calculated): 33.22   PHYSICAL EXAM:  Physical Exam Vitals and nursing note reviewed. Exam conducted with a chaperone present.  Constitutional:      General: She is not in acute distress.    Appearance: She is well-developed. She is obese. She is not ill-appearing.     Comments: Patient resting bed; NAD. Husband at bedside   HENT:     Head: Normocephalic and atraumatic.  Eyes:     General: No scleral icterus.    Extraocular Movements: Extraocular movements intact.  Cardiovascular:     Rate and Rhythm: Normal rate and regular rhythm.     Heart sounds: Normal heart sounds. No murmur heard. Pulmonary:     Effort: Pulmonary effort is normal. No respiratory distress.  Abdominal:     General: A surgical scar is present. There is no distension.     Palpations: Abdomen is soft.     Tenderness: There is abdominal tenderness in the right lower quadrant. There is no guarding or rebound. Positive signs include McBurney's sign.  Genitourinary:    Comments: Deferred Skin:    General: Skin is warm and dry.     Coloration: Skin is not jaundiced or pale.  Neurological:     General: No focal deficit present.     Mental Status: She is alert and oriented to person, place, and time.  Psychiatric:        Mood and Affect: Mood normal.        Behavior: Behavior normal.     INTAKE/OUTPUT:  This shift: No intake/output data recorded.  Last 2 shifts: '@IOLAST2SHIFTS'$ @  Labs:     Latest Ref Rng  & Units 12/05/2022    2:31 AM 01/27/2020    8:35 AM 07/27/2018    3:58 PM  CBC  WBC 4.0 - 10.5 K/uL 13.0  7.6  7.7   Hemoglobin 12.0 - 15.0 g/dL 13.7  14.1  13.6   Hematocrit 36.0 - 46.0 % 41.3  41.2  41.6   Platelets 150 - 400 K/uL 271  239  257       Latest Ref Rng & Units 12/05/2022    2:31 AM 01/27/2020    8:35 AM  07/27/2018    3:58 PM  CMP  Glucose 70 - 99 mg/dL 147  91  90   BUN 8 - 23 mg/dL '22  10  14   '$ Creatinine 0.44 - 1.00 mg/dL 0.81  0.84  0.85   Sodium 135 - 145 mmol/L 140  145  145   Potassium 3.5 - 5.1 mmol/L 3.6  4.8  4.8   Chloride 98 - 111 mmol/L 104  106  106   CO2 22 - 32 mmol/L '25  24  25   '$ Calcium 8.9 - 10.3 mg/dL 9.6  9.5  9.9   Total Protein 6.5 - 8.1 g/dL 7.2  6.7  7.1   Total Bilirubin 0.3 - 1.2 mg/dL 1.0  0.3  0.5   Alkaline Phos 38 - 126 U/L 59  78  61   AST 15 - 41 U/L '22  19  16   '$ ALT 0 - 44 U/L '18  14  15     '$ Imaging studies:   CT Abdomen/Pelvis (12/05/2022) personally reviewed showing acute uncomplicated appendicitis, retrocecal, no abscess or free air, and radiologist report reviewed below:   IMPRESSION: 1. Mildly dilated appendix with mucosal hyper-enhancement and periappendiceal inflammation, consistent with acute appendicitis. No free air or abscess formation.   Assessment/Plan: (ICD-10's: K35.80) 67 y.o. female with acute uncomplicated appendicitis.    - Plan for laparoscopic appendectomy this afternoon with Dr Hampton Abbot pending OR/Anesthesia availability  - All risks, benefits, and alternatives to above procedure(s) were discussed with the patient, all of her questions were answered to her expressed satisfaction, patient expresses she wishes to proceed, and informed consent was obtained.   - NPO + IVF Support   - IV Abx (Zosyn) - Monitor abdominal examination - Pain control prn; antiemetics prn   All of the above findings and recommendations were discussed with the patient and her family at bedside, and all of their questions were answered  to their expressed satisfaction.  -- Edison Simon, PA-C [facility] 12/05/2022, 7:28 AM M-F: 7am - 4pm

## 2022-12-06 ENCOUNTER — Encounter: Payer: Self-pay | Admitting: Surgery

## 2022-12-06 LAB — CBC
HCT: 35.1 % — ABNORMAL LOW (ref 36.0–46.0)
Hemoglobin: 11.6 g/dL — ABNORMAL LOW (ref 12.0–15.0)
MCH: 28.3 pg (ref 26.0–34.0)
MCHC: 33 g/dL (ref 30.0–36.0)
MCV: 85.6 fL (ref 80.0–100.0)
Platelets: 209 10*3/uL (ref 150–400)
RBC: 4.1 MIL/uL (ref 3.87–5.11)
RDW: 13.1 % (ref 11.5–15.5)
WBC: 14 10*3/uL — ABNORMAL HIGH (ref 4.0–10.5)
nRBC: 0 % (ref 0.0–0.2)

## 2022-12-06 LAB — BASIC METABOLIC PANEL
Anion gap: 9 (ref 5–15)
BUN: 15 mg/dL (ref 8–23)
CO2: 23 mmol/L (ref 22–32)
Calcium: 8.5 mg/dL — ABNORMAL LOW (ref 8.9–10.3)
Chloride: 106 mmol/L (ref 98–111)
Creatinine, Ser: 0.79 mg/dL (ref 0.44–1.00)
GFR, Estimated: >60 mL/min (ref >60)
Glucose, Bld: 123 mg/dL — ABNORMAL HIGH (ref 70–99)
Potassium: 3.6 mmol/L (ref 3.5–5.1)
Sodium: 138 mmol/L (ref 135–145)

## 2022-12-06 MED ORDER — OXYCODONE HCL 5 MG PO TABS: 5.0000 mg | ORAL_TABLET | Freq: Four times a day (QID) | ORAL | 0 refills | Status: DC | PRN

## 2022-12-06 MED ORDER — AMOXICILLIN-POT CLAVULANATE 875-125 MG PO TABS: 1.0000 | ORAL_TABLET | Freq: Two times a day (BID) | ORAL | 0 refills | Status: AC

## 2022-12-06 MED ORDER — IBUPROFEN 600 MG PO TABS: 600.0000 mg | ORAL_TABLET | Freq: Four times a day (QID) | ORAL | 0 refills | Status: DC | PRN

## 2022-12-06 MED ORDER — ONDANSETRON 4 MG PO TBDP: 4.0000 mg | ORAL_TABLET | Freq: Four times a day (QID) | ORAL | 0 refills | Status: AC | PRN

## 2022-12-06 NOTE — Progress Notes (Signed)
Tina [name] to be discharged Home per MD order. Discussed prescriptions and follow up appointments with the patient. Prescriptions given to patient, medication list explained in detail. Patient verbalized understanding.  Allergies as of 12/06/2022   No Known Allergies      Medication List     TAKE these medications    amoxicillin-clavulanate 875-125 MG tablet Commonly known as: AUGMENTIN Take 1 tablet by mouth 2 (two) times daily for 7 days.   benzonatate 100 MG capsule Commonly known as: TESSALON Take 1 capsule (100 mg total) by mouth 3 (three) times daily as needed for cough.   CALCIUM + D PO Take by mouth.   ibuprofen 600 MG tablet Commonly known as: ADVIL Take 1 tablet (600 mg total) by mouth every 6 (six) hours as needed.   Magnesium 500 MG Tabs Take 500 mg by mouth.   mometasone 0.1 % cream Commonly known as: Elocon Apply once daily to affected area   ondansetron 4 MG disintegrating tablet Commonly known as: ZOFRAN-ODT Take 1 tablet (4 mg total) by mouth every 6 (six) hours as needed for nausea.   oxyCODONE 5 MG immediate release tablet Commonly known as: Oxy IR/ROXICODONE Take 1 tablet (5 mg total) by mouth every 6 (six) hours as needed for severe pain or breakthrough pain.   triamcinolone cream 0.1 % Commonly known as: KENALOG Apply BID to affected area        Vitals:   12/06/22 0806 12/06/22 0826  BP: 106/72 (!) 108/59  Pulse: 62 61  Resp: 16 18  Temp: 98 F (36.7 C) 98.1 F (36.7 C)  SpO2: 96% 98%    Skin clean, dry and intact without evidence of skin break down and or skin tears. IV catheter discontinued intact. Site without signs and symptoms of complications. Dressing and pressure applied. Patient denies pain at this time. No complaints noted.  An After Visit Summary was printed and given to the patient. Patient escorted via wheelchair and discharged Home home via private auto.  Fuller Mandril, RN

## 2022-12-06 NOTE — Discharge Summary (Signed)
[facility] SURGICAL DISCHARGE SUMMARY  Patient ID: Tina [name] MRN: [id number] DOB/AGE: [id number] 65 y.o.  Admit date: 12/05/2022 Discharge date: 12/06/2022  Discharge Diagnoses Patient Active Problem List   Diagnosis Date Noted   Acute appendicitis 12/05/2022    Consultants None  Procedures 12/05/2022:  Laparoscopic Appendectomy   HPI: 67 y.o. female presented to [facility] ED overnight for abdominal pain. Patient reports the acute onset of abdominal pain around 10 PM last night. This was located in her RLQ and sharp in nature. She reported associated chills, nausea, and emesis as well. No fever, cough, CP, SOB, urinary changes, or bowel changes. No history of similar in the past. Previous intra-abdominal surgeries positive for abdominal hysterectomy and BSO. Work up in the ED revealed acute uncomplicated appendicitis.   Hospital Course: Informed consent was obtained and documented, and patient underwent uneventful laparoscopic appendectomy (Dr Hampton Abbot, 12/05/2022).  Post-operatively, patient's did well. Advancement of patient's diet and ambulation were well-tolerated. The remainder of patient's hospital course was essentially unremarkable, and discharge planning was initiated accordingly with patient safely able to be discharged home with appropriate discharge instructions, antibiotics (Augmentin x7 days), pain control, and outpatient follow-up after all of her questions were answered to their expressed satisfaction.   Discharge Condition: Good   Physical Examination:  Constitutional: Well appearing female, NAD Pulmonary: Normal effort; no respiratory distress Gastrointestinal: Soft, incisional soreness, non-distended, no rebound/guarding Skin: Laparoscopic incisions are CDI with dermabond, no erythema    Allergies as of 12/06/2022   No Known Allergies      Medication List     TAKE these medications    amoxicillin-clavulanate 875-125 MG tablet Commonly  known as: AUGMENTIN Take 1 tablet by mouth 2 (two) times daily for 7 days.   benzonatate 100 MG capsule Commonly known as: TESSALON Take 1 capsule (100 mg total) by mouth 3 (three) times daily as needed for cough.   CALCIUM + D PO Take by mouth.   ibuprofen 600 MG tablet Commonly known as: ADVIL Take 1 tablet (600 mg total) by mouth every 6 (six) hours as needed.   Magnesium 500 MG Tabs Take 500 mg by mouth.   mometasone 0.1 % cream Commonly known as: Elocon Apply once daily to affected area   ondansetron 4 MG disintegrating tablet Commonly known as: ZOFRAN-ODT Take 1 tablet (4 mg total) by mouth every 6 (six) hours as needed for nausea.   oxyCODONE 5 MG immediate release tablet Commonly known as: Oxy IR/ROXICODONE Take 1 tablet (5 mg total) by mouth every 6 (six) hours as needed for severe pain or breakthrough pain.   triamcinolone cream 0.1 % Commonly known as: KENALOG Apply BID to affected area          Follow-up Information     Tylene Fantasia, PA-C. Schedule an appointment as soon as possible for a visit in 3 week(s).   Specialty: Physician Assistant Why: s/p laparoscopic appendecotmy Contact information: [address] [place] [address] [phone]                  Time spent on discharge management including discussion of hospital course, clinical condition, outpatient instructions, prescriptions, and follow up with the patient and members of the medical team: >30 minutes  -- Edison Simon , PA-C [facility]  12/06/2022, 8:36 AM [phone] M-F: 7am - 4pm

## 2022-12-06 NOTE — Discharge Instructions (Signed)
In addition to included general post-operative instructions,  Diet: Resume home diet.   Activity: No heavy lifting >20 pounds (children, pets, laundry, garbage) or strenuous activity for 4 weeks, but light activity and walking are encouraged. Do not drive or drink alcohol if taking narcotic pain medications or having pain that might distract from driving.  Wound care: 2 days after surgery (02/07), you may shower/get incision wet with soapy water and pat dry (do not rub incisions), but no baths or submerging incision underwater until follow-up.   Medications: Resume all home medications. For mild to moderate pain: acetaminophen (Tylenol) or ibuprofen/naproxen (if no kidney disease). Combining Tylenol with alcohol can substantially increase your risk of causing liver disease. Narcotic pain medications, if prescribed, can be used for severe pain, though may cause nausea, constipation, and drowsiness. Do not combine Tylenol and Percocet (or similar) within a 6 hour period as Percocet (and similar) contain(s) Tylenol. If you do not need the narcotic pain medication, you do not need to fill the prescription.  Call office [phone] / [phone]) at any time if any questions, worsening pain, fevers/chills, bleeding, drainage from incision site, or other concerns.

## 2022-12-06 NOTE — Plan of Care (Signed)
  Problem: Health Behavior/Discharge Planning: Goal: Ability to manage health-related needs will improve Outcome: Progressing   Problem: Activity: Goal: Risk for activity intolerance will decrease Outcome: Progressing   Problem: Nutrition: Goal: Adequate nutrition will be maintained Outcome: Progressing   Problem: Elimination: Goal: Will not experience complications related to bowel motility Outcome: Progressing Goal: Will not experience complications related to urinary retention Outcome: Progressing   Problem: Pain Managment: Goal: General experience of comfort will improve Outcome: Progressing   Problem: Skin Integrity: Goal: Risk for impaired skin integrity will decrease Outcome: Progressing

## 2022-12-07 NOTE — Anesthesia Postprocedure Evaluation (Signed)
Anesthesia Post Note  Patient: Tina [name]  Procedure(s) Performed: APPENDECTOMY LAPAROSCOPIC  Patient location during evaluation: PACU Anesthesia Type: General Level of consciousness: awake and awake and alert Pain management: pain level controlled Vital Signs Assessment: post-procedure vital signs reviewed and stable Respiratory status: spontaneous breathing Cardiovascular status: stable Anesthetic complications: no  No notable events documented.   Last Vitals:  Vitals:   12/06/22 0806 12/06/22 0826  BP: 106/72 (!) 108/59  Pulse: 62 61  Resp: 16 18  Temp: 36.7 C 36.7 C  SpO2: 96% 98%    Last Pain:  Vitals:   12/06/22 0826  TempSrc: Oral  PainSc:                  VAN STAVEREN,Margaretta Chittum

## 2022-12-08 LAB — SURGICAL PATHOLOGY

## 2022-12-27 ENCOUNTER — Encounter: Payer: Self-pay | Admitting: Physician Assistant

## 2022-12-27 ENCOUNTER — Ambulatory Visit (INDEPENDENT_AMBULATORY_CARE_PROVIDER_SITE_OTHER): Payer: BC Managed Care – PPO | Admitting: Physician Assistant

## 2022-12-27 VITALS — BP 132/82 | HR 67 | Temp 98.2°F | Ht 62.0 in | Wt 182.0 lb

## 2022-12-27 DIAGNOSIS — Z09 Encounter for follow-up examination after completed treatment for conditions other than malignant neoplasm: Secondary | ICD-10-CM

## 2022-12-27 DIAGNOSIS — K353 Acute appendicitis with localized peritonitis, without perforation or gangrene: Secondary | ICD-10-CM

## 2022-12-27 DIAGNOSIS — K3580 Unspecified acute appendicitis: Secondary | ICD-10-CM

## 2022-12-27 NOTE — Patient Instructions (Signed)

## 2022-12-27 NOTE — Progress Notes (Signed)
[facility] POST-OP OFFICE VISIT  12/27/2022  HPI: Tina [name] is a 67 y.o. female 22 days s/p laparoscopic appendectomy for acute appendicitis with Dr Hampton Abbot.   She has done markedly well No fever, chills, nausea, emesis She had some soreness for a few days but only need narcotics twice Incisions are well healed; no erythema No other complaints   Vital signs: BP 132/82   Pulse 67   Temp 98.2 F (36.8 C) (Oral)   Ht '5\' 2"'$  (1.575 m)   Wt 182 lb (82.6 kg)   SpO2 97%   BMI 33.29 kg/m    Physical Exam: Constitutional: Well appearing female, NAD Abdomen: Soft, non-tender, non-distended, no rebound/guarding Skin: Laparoscopic incisions are healing well, no erythema or drainage   Assessment/Plan: This is a 67 y.o. female 22 days s/p laparoscopic appendectomy for acute appendicitis with Dr Hampton Abbot.    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

## 2023-05-16 ENCOUNTER — Other Ambulatory Visit: Payer: Self-pay | Admitting: Internal Medicine

## 2023-05-16 DIAGNOSIS — Z1231 Encounter for screening mammogram for malignant neoplasm of breast: Secondary | ICD-10-CM

## 2023-06-20 ENCOUNTER — Ambulatory Visit
Admission: RE | Admit: 2023-06-20 | Discharge: 2023-06-20 | Disposition: A | Discharge status: Home / Self Care | Payer: BC Managed Care – PPO | Source: Ambulatory Visit | Attending: Internal Medicine | Admitting: Internal Medicine

## 2023-06-20 DIAGNOSIS — Z1231 Encounter for screening mammogram for malignant neoplasm of breast: Secondary | ICD-10-CM | POA: Insufficient documentation

## 2024-05-17 ENCOUNTER — Encounter: Payer: Self-pay | Admitting: Advanced Practice Midwife

## 2024-05-20 ENCOUNTER — Other Ambulatory Visit: Payer: Self-pay | Admitting: Internal Medicine

## 2024-05-20 DIAGNOSIS — Z1231 Encounter for screening mammogram for malignant neoplasm of breast: Secondary | ICD-10-CM

## 2024-06-25 ENCOUNTER — Ambulatory Visit
Admission: RE | Admit: 2024-06-25 | Discharge: 2024-06-25 | Disposition: A | Discharge status: Home / Self Care | Payer: Self-pay | Source: Ambulatory Visit | Attending: Internal Medicine | Admitting: Internal Medicine

## 2024-06-25 DIAGNOSIS — Z1231 Encounter for screening mammogram for malignant neoplasm of breast: Secondary | ICD-10-CM | POA: Insufficient documentation
